# Patient Record
Sex: Female | Born: 1980 | Race: Black or African American | Hispanic: No | Marital: Single | Smoking: Never smoker
Health system: Southern US, Community
[De-identification: ages and names within clinical notes are randomized; demographics above are authoritative.]

## PROBLEM LIST (undated history)

## (undated) DIAGNOSIS — D649 Anemia, unspecified: Secondary | ICD-10-CM

## (undated) DIAGNOSIS — N926 Irregular menstruation, unspecified: Secondary | ICD-10-CM

## (undated) DIAGNOSIS — I809 Phlebitis and thrombophlebitis of unspecified site: Secondary | ICD-10-CM

## (undated) HISTORY — PX: GANGLION CYST EXCISION: SHX1691

---
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
## 2002-04-20 ENCOUNTER — Encounter: Admission: RE | Admit: 2002-04-20 | Discharge: 2002-04-20 | Payer: Self-pay | Admitting: Family Medicine

## 2002-04-27 ENCOUNTER — Encounter: Admission: RE | Admit: 2002-04-27 | Discharge: 2002-04-27 | Payer: Self-pay | Admitting: Family Medicine

## 2002-04-27 ENCOUNTER — Encounter (INDEPENDENT_AMBULATORY_CARE_PROVIDER_SITE_OTHER): Payer: Self-pay | Admitting: *Deleted

## 2002-05-02 ENCOUNTER — Ambulatory Visit (HOSPITAL_COMMUNITY): Admission: RE | Admit: 2002-05-02 | Discharge: 2002-05-02 | Payer: Self-pay | Admitting: Obstetrics and Gynecology

## 2002-06-15 ENCOUNTER — Encounter: Payer: Self-pay | Admitting: *Deleted

## 2002-06-15 ENCOUNTER — Inpatient Hospital Stay (HOSPITAL_COMMUNITY): Admission: AD | Admit: 2002-06-15 | Discharge: 2002-06-15 | Payer: Self-pay | Admitting: *Deleted

## 2002-06-21 ENCOUNTER — Inpatient Hospital Stay (HOSPITAL_COMMUNITY): Admission: AD | Admit: 2002-06-21 | Discharge: 2002-06-21 | Payer: Self-pay | Admitting: *Deleted

## 2002-06-21 ENCOUNTER — Encounter: Admission: RE | Admit: 2002-06-21 | Discharge: 2002-06-21 | Payer: Self-pay | Admitting: Family Medicine

## 2002-06-22 ENCOUNTER — Encounter: Admission: RE | Admit: 2002-06-22 | Discharge: 2002-06-22 | Payer: Self-pay | Admitting: Family Medicine

## 2002-06-29 ENCOUNTER — Inpatient Hospital Stay (HOSPITAL_COMMUNITY): Admission: AD | Admit: 2002-06-29 | Discharge: 2002-06-29 | Payer: Self-pay | Admitting: Family Medicine

## 2002-06-29 ENCOUNTER — Encounter: Admission: RE | Admit: 2002-06-29 | Discharge: 2002-06-29 | Payer: Self-pay | Admitting: Family Medicine

## 2002-07-04 ENCOUNTER — Inpatient Hospital Stay (HOSPITAL_COMMUNITY): Admission: AD | Admit: 2002-07-04 | Discharge: 2002-07-06 | Payer: Self-pay | Admitting: Obstetrics and Gynecology

## 2002-07-04 ENCOUNTER — Encounter (INDEPENDENT_AMBULATORY_CARE_PROVIDER_SITE_OTHER): Payer: Self-pay

## 2002-12-12 ENCOUNTER — Encounter: Admission: RE | Admit: 2002-12-12 | Discharge: 2002-12-12 | Payer: Self-pay | Admitting: Family Medicine

## 2003-03-15 ENCOUNTER — Emergency Department (HOSPITAL_COMMUNITY): Admission: EM | Admit: 2003-03-15 | Discharge: 2003-03-15 | Payer: Self-pay | Admitting: Emergency Medicine

## 2003-06-11 ENCOUNTER — Other Ambulatory Visit: Admission: RE | Admit: 2003-06-11 | Discharge: 2003-06-11 | Payer: Self-pay | Admitting: Family Medicine

## 2003-06-11 ENCOUNTER — Encounter: Admission: RE | Admit: 2003-06-11 | Discharge: 2003-06-11 | Payer: Self-pay | Admitting: Sports Medicine

## 2003-06-11 ENCOUNTER — Encounter (INDEPENDENT_AMBULATORY_CARE_PROVIDER_SITE_OTHER): Payer: Self-pay | Admitting: *Deleted

## 2003-06-28 ENCOUNTER — Encounter: Admission: RE | Admit: 2003-06-28 | Discharge: 2003-06-28 | Payer: Self-pay | Admitting: Sports Medicine

## 2003-11-25 ENCOUNTER — Inpatient Hospital Stay (HOSPITAL_COMMUNITY): Admission: AD | Admit: 2003-11-25 | Discharge: 2003-11-30 | Payer: Self-pay | Admitting: Obstetrics & Gynecology

## 2003-12-24 ENCOUNTER — Inpatient Hospital Stay (HOSPITAL_COMMUNITY): Admission: AD | Admit: 2003-12-24 | Discharge: 2003-12-26 | Payer: Self-pay | Admitting: Obstetrics and Gynecology

## 2004-01-01 ENCOUNTER — Inpatient Hospital Stay (HOSPITAL_COMMUNITY): Admission: AD | Admit: 2004-01-01 | Discharge: 2004-01-04 | Payer: Self-pay | Admitting: Obstetrics and Gynecology

## 2004-04-07 ENCOUNTER — Ambulatory Visit: Payer: Self-pay | Admitting: Obstetrics & Gynecology

## 2006-01-01 ENCOUNTER — Emergency Department (HOSPITAL_COMMUNITY): Admission: EM | Admit: 2006-01-01 | Discharge: 2006-01-01 | Payer: Self-pay | Admitting: Emergency Medicine

## 2006-04-06 ENCOUNTER — Emergency Department (HOSPITAL_COMMUNITY): Admission: EM | Admit: 2006-04-06 | Discharge: 2006-04-06 | Payer: Self-pay | Admitting: Family Medicine

## 2006-07-12 HISTORY — PX: TUBAL LIGATION: SHX77

## 2006-07-27 ENCOUNTER — Emergency Department (HOSPITAL_COMMUNITY): Admission: EM | Admit: 2006-07-27 | Discharge: 2006-07-27 | Payer: Self-pay | Admitting: Family Medicine

## 2006-11-24 ENCOUNTER — Inpatient Hospital Stay (HOSPITAL_COMMUNITY): Admission: AD | Admit: 2006-11-24 | Discharge: 2006-11-24 | Payer: Self-pay | Admitting: Family Medicine

## 2007-01-09 ENCOUNTER — Ambulatory Visit (HOSPITAL_COMMUNITY): Admission: RE | Admit: 2007-01-09 | Discharge: 2007-01-09 | Payer: Self-pay | Admitting: Obstetrics

## 2007-02-01 ENCOUNTER — Ambulatory Visit (HOSPITAL_COMMUNITY): Admission: RE | Admit: 2007-02-01 | Discharge: 2007-02-01 | Payer: Self-pay | Admitting: Obstetrics

## 2007-04-13 ENCOUNTER — Inpatient Hospital Stay (HOSPITAL_COMMUNITY): Admission: AD | Admit: 2007-04-13 | Discharge: 2007-04-13 | Payer: Self-pay | Admitting: Obstetrics & Gynecology

## 2007-06-05 ENCOUNTER — Observation Stay (HOSPITAL_COMMUNITY): Admission: AD | Admit: 2007-06-05 | Discharge: 2007-06-05 | Payer: Self-pay | Admitting: Obstetrics & Gynecology

## 2007-06-08 ENCOUNTER — Inpatient Hospital Stay (HOSPITAL_COMMUNITY): Admission: AD | Admit: 2007-06-08 | Discharge: 2007-06-08 | Payer: Self-pay | Admitting: Obstetrics

## 2007-06-17 ENCOUNTER — Inpatient Hospital Stay (HOSPITAL_COMMUNITY): Admission: AD | Admit: 2007-06-17 | Discharge: 2007-06-19 | Payer: Self-pay | Admitting: Obstetrics & Gynecology

## 2007-06-18 ENCOUNTER — Encounter: Payer: Self-pay | Admitting: Obstetrics & Gynecology

## 2008-02-15 ENCOUNTER — Emergency Department (HOSPITAL_COMMUNITY): Admission: EM | Admit: 2008-02-15 | Discharge: 2008-02-15 | Payer: Self-pay | Admitting: Emergency Medicine

## 2008-04-07 ENCOUNTER — Emergency Department (HOSPITAL_COMMUNITY): Admission: EM | Admit: 2008-04-07 | Discharge: 2008-04-07 | Payer: Self-pay | Admitting: Emergency Medicine

## 2008-04-12 ENCOUNTER — Emergency Department (HOSPITAL_COMMUNITY): Admission: EM | Admit: 2008-04-12 | Discharge: 2008-04-12 | Payer: Self-pay | Admitting: Family Medicine

## 2008-11-07 IMAGING — US US OB FOLLOW-UP
1 series · 14 of 28 positions shown · non-contrast
Comparison: none

OBSTETRICAL ULTRASOUND:

 This ultrasound exam was performed in the [HOSPITAL] Ultrasound Department.  The OB US report was generated in the AS system, and faxed to the ordering physician.  This report is also available in [REDACTED] PACS.

[Series 1: us ob follow-up · 0.30mm/px · 14 of 46 slices shown]
[im 2/46]
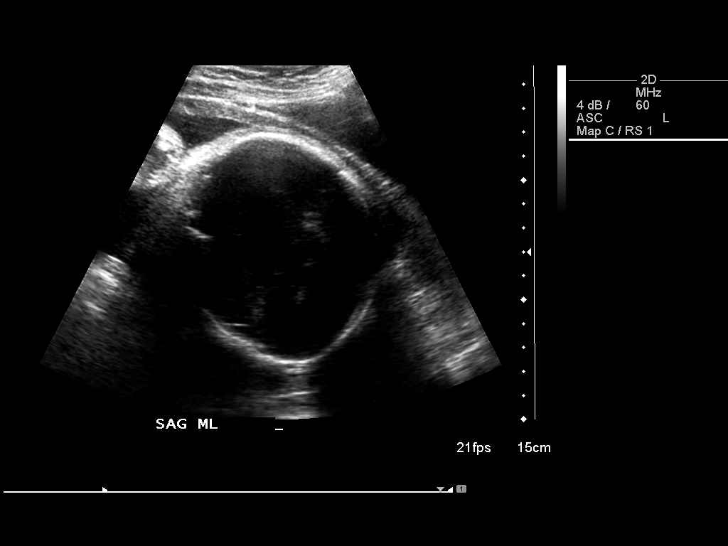
[im 6/46]
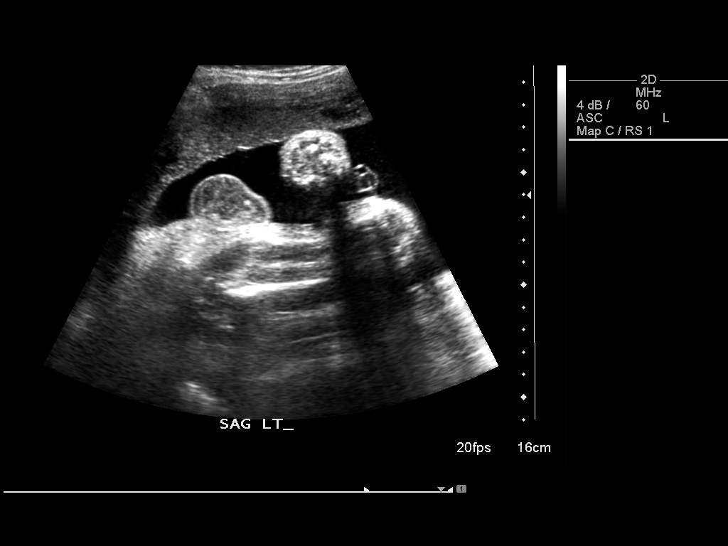
[im 9/46]
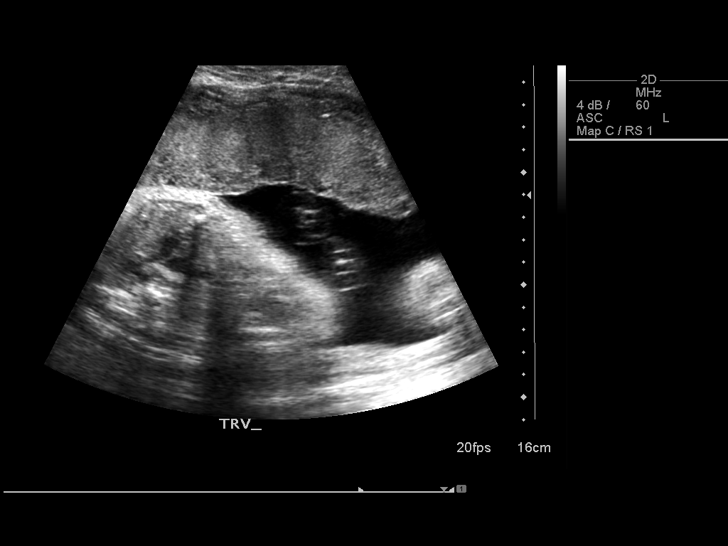
[im 12/46]
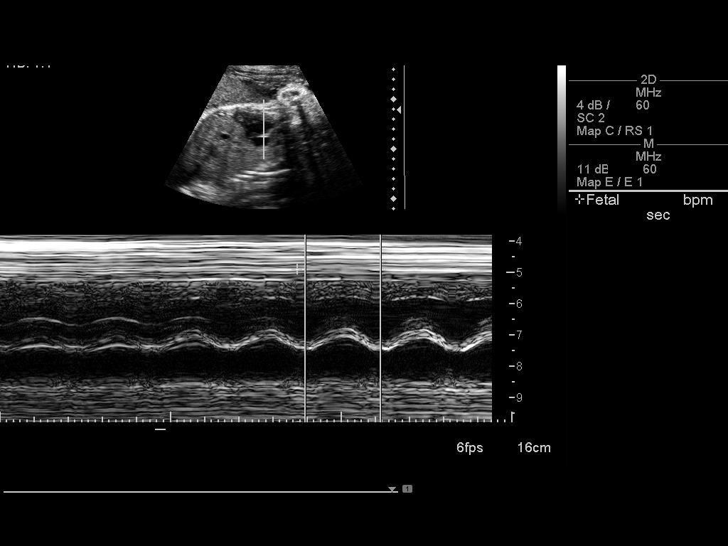
[im 16/46]
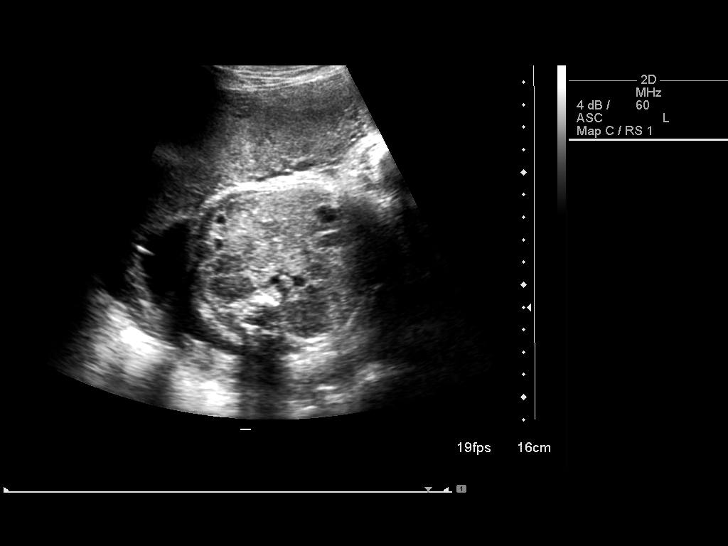
[im 19/46]
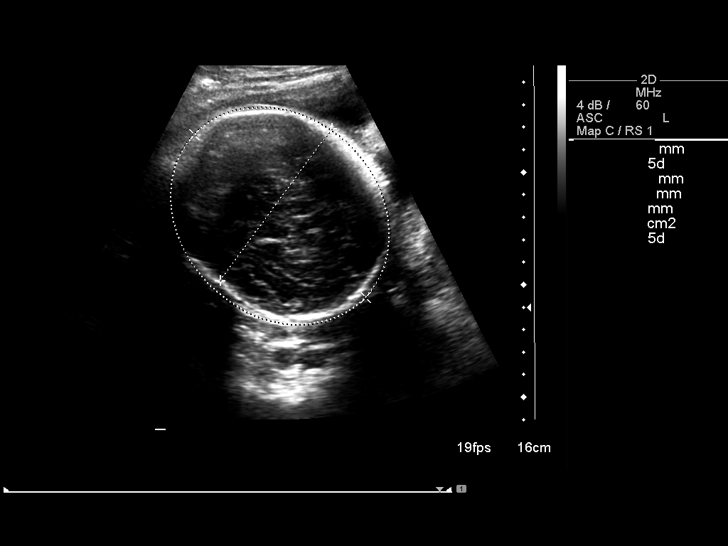
[im 22/46]
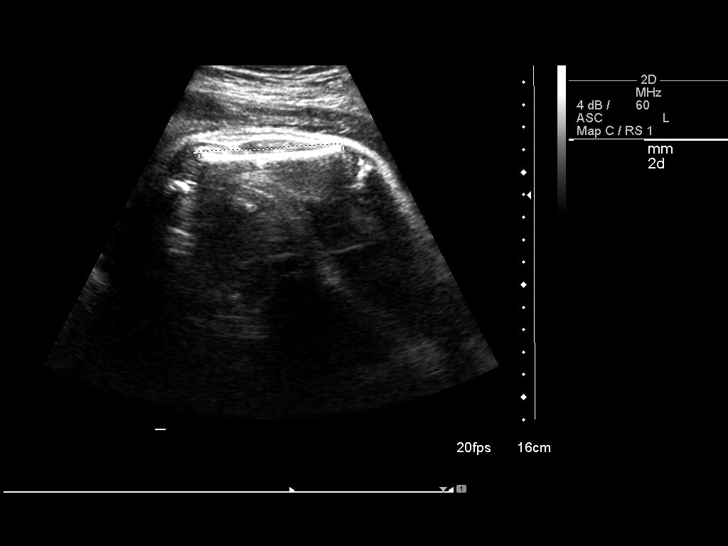
[im 26/46]
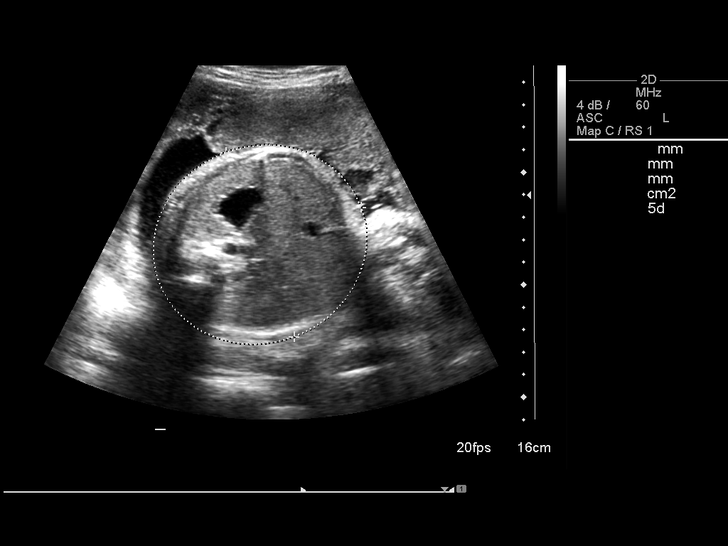
[im 29/46]
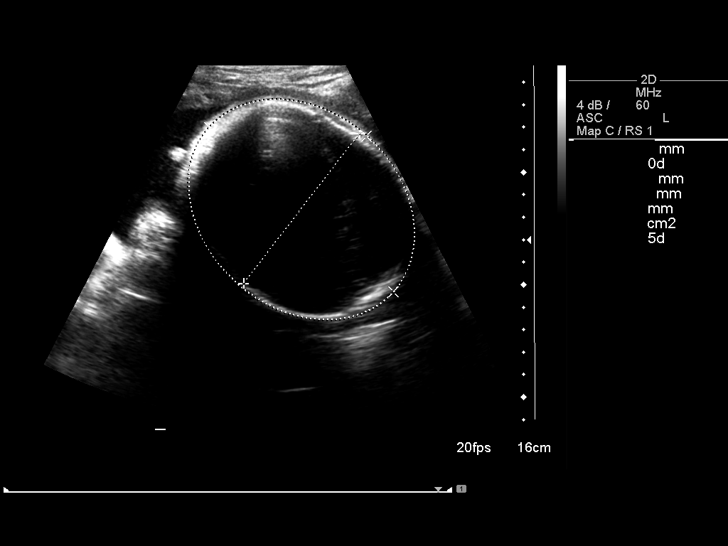
[im 32/46]
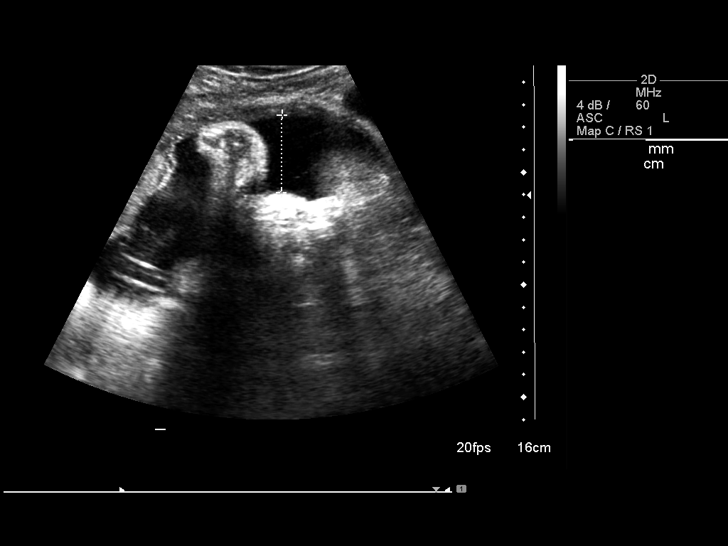
[im 36/46]
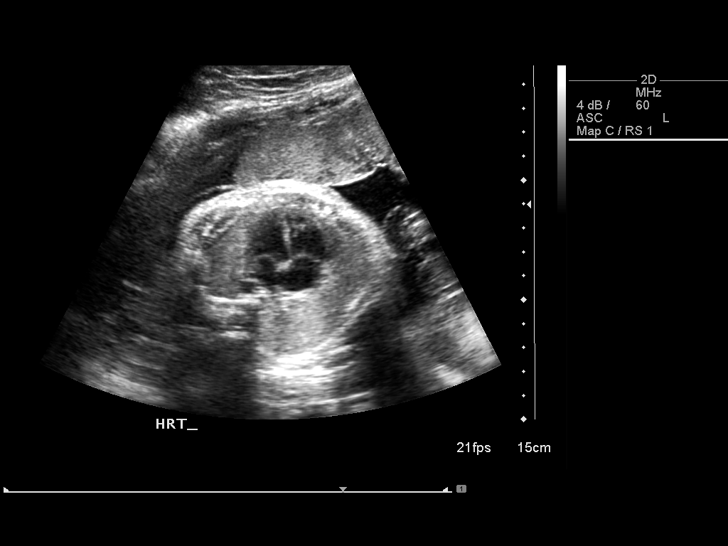
[im 39/46]
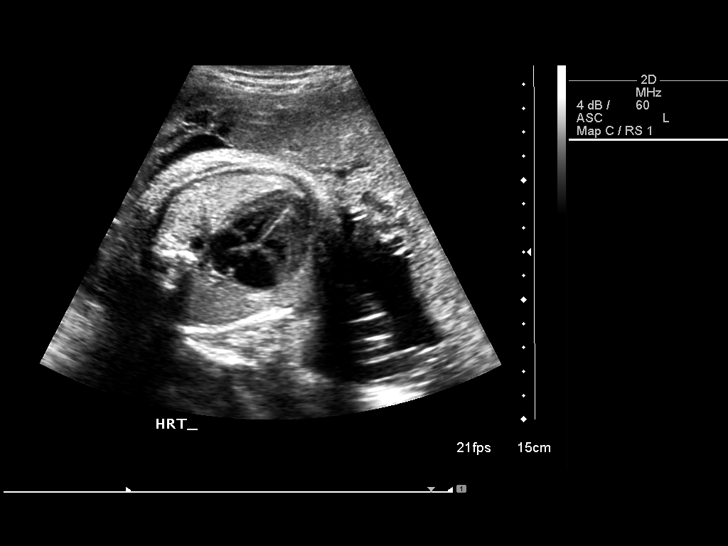
[im 42/46]
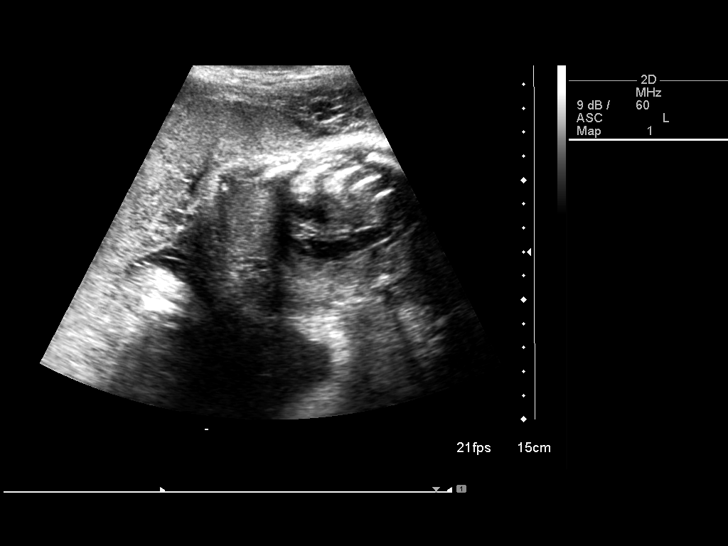
[im 46/46]
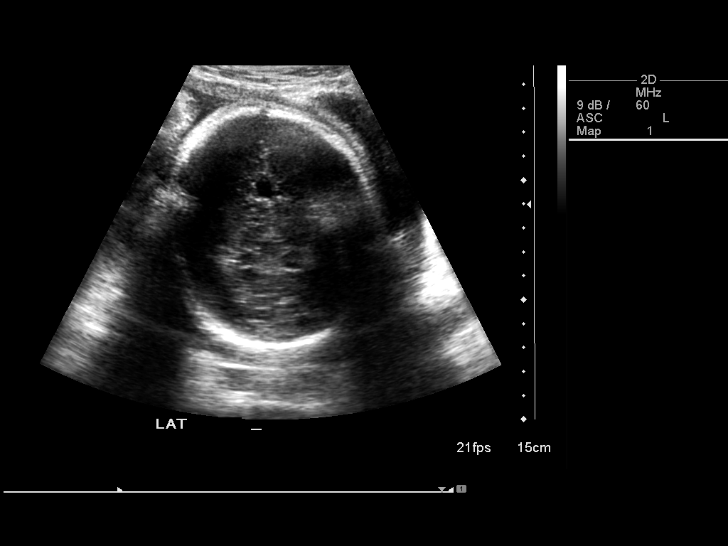

[14 of 28 positions shown; findings below may reference images not displayed]

IMPRESSION: See AS Obstetric US report.

## 2010-01-04 ENCOUNTER — Emergency Department (HOSPITAL_COMMUNITY): Admission: EM | Admit: 2010-01-04 | Discharge: 2010-01-04 | Payer: Self-pay | Admitting: Family Medicine

## 2010-08-02 ENCOUNTER — Encounter: Payer: Self-pay | Admitting: Obstetrics

## 2010-09-30 ENCOUNTER — Inpatient Hospital Stay (INDEPENDENT_AMBULATORY_CARE_PROVIDER_SITE_OTHER)
Admission: RE | Admit: 2010-09-30 | Discharge: 2010-09-30 | Disposition: A | Discharge status: Home / Self Care | Payer: Medicaid Other | Source: Ambulatory Visit | Attending: Emergency Medicine | Admitting: Emergency Medicine

## 2010-09-30 DIAGNOSIS — S61409A Unspecified open wound of unspecified hand, initial encounter: Secondary | ICD-10-CM

## 2010-11-24 NOTE — H&P (Signed)
NAMEWANEDA, KLAMMER                   ACCOUNT NO.:  [id number]   MEDICAL RECORD NO.:  [id number]          PATIENT TYPE:  INP   LOCATION:  9169                          [facility]ACILITY:  [facility]   PHYSICIAN:  Roseanna Rainbow, M.D.DATE OF BIRTH:  [birth year]   DATE OF ADMISSION:  06/17/2007  DATE OF DISCHARGE:                              HISTORY & PHYSICAL   CHIEF COMPLAINT:  The patient is a 30 year old gravida 5, para 4 with an  estimated date of confinement of July 11, 2007 with an intrauterine  pregnancy at 37 weeks complaining of uterine contractions.   HISTORY OF PRESENT ILLNESS:  Please see the above.   ALLERGIES:  NO KNOWN DRUG ALLERGIES.   MEDICATIONS:  Please see the medication reconciliation form.   LABORATORY DATA:  Prenatal labs:  Blood type B+, antibody screen  negative, RPR nonreactive, rubella immune, hepatitis B surface antigen  negative, HIV nonreactive.   DIAGNOSTICS:  Ultrasound on February 01, 2007 at 17 weeks 1 day gave a due  date of July 11, 2007.  There was no previa.   PAST GYNECOLOGICAL HISTORY:  Noncontributory.   PAST MEDICAL HISTORY:  She denies.   PAST SURGICAL HISTORY:  Hand surgery.   SOCIAL HISTORY:  She is a homemaker, single.  No tobacco, ethanol or  drug use.   FAMILY HISTORY:  Uterine cancer.   PAST OBSTETRICAL HISTORY:  In 2001, she was delivered of a live born  female, 6 pounds 9 ounces vaginal delivery, full term.  In 2003, she was  delivered of a live born female 6 pounds 7 ounces vaginal delivery.  In  2003, she had a preterm delivery at 59 weeks female 3 pounds 4 ounces  vaginal delivery.  In 2005, she had a live born female full term vaginal  delivery 6 pounds.   DELIVERY NOTE:  The patient had a precipitous delivery of a live born  female.  The cord was clamped and cut.  The placenta was delivered with  cord traction intact, three-vessel cord.  There were minor vaginal and  labial abrasions.  No lacerations.  Estimated blood loss less  than 500  cc.  Mother and infant in stable condition.      Roseanna Rainbow, M.D.  Electronically Signed     LAJ/MEDQ  D:  06/17/2007  T:  06/17/2007  Job:  [id number]

## 2010-11-24 NOTE — Op Note (Signed)
Claudia Washington, Claudia Washington                   ACCOUNT NO.:  [id number]   MEDICAL RECORD NO.:  [id number]          PATIENT TYPE:  INP   LOCATION:  9128                          [facility]ILITY[facility]   PHYSICIAN:  Roseanna Rainbow, M.D.DATE OF BIRTH:  [birth year]   DATE OF PROCEDURE:  06/18/2007  DATE OF DISCHARGE:                               OPERATIVE REPORT   PREOPERATIVE DIAGNOSIS:  Multiparity, desires a sterilization procedure.   POSTOPERATIVE DIAGNOSIS:  Multiparity, desires a sterilization  procedure.   PROCEDURES:  1. Modified Pomeroy bilateral tubal ligation.  2. Removal of a paratubal cyst.   SURGEON:  Roseanna Rainbow, MD   ANESTHESIA:  General endotracheal.   ESTIMATED BLOOD LOSS:  Minimal.   COMPLICATIONS:  None.   IV FLUIDS:  As per anesthesiology.   PROCEDURE:  The patient was taken to the operating room.  A general  anesthetic was induced without difficulty.  She was then placed in the  dorsal supine position and prepped and draped in the usual sterile  fashion.  After a time-out had been completed, an infraumbilical skin  incision was made with a scalpel and carried down to the underlying  fascia.  The fascia was tented up and entered sharply.  This incision  was extended.  The parietal peritoneum was tented up and entered  sharply.  This incision was then extended bilaterally and was free of  adhesions.  The left fallopian tube was grasped with a Babcock clamp and  followed out to the fimbriated end.  The midisthmic portion of the tube  was regrasped.  A 2-cm segment of tube was then doubly ligated with 0  plain and excised.  The right fallopian tube was manipulated in a  similar fashion.  Emanating from the ampullary portion was a 1-cm  hydatid cyst of Morgagni, which was excised with Metzenbaum scissors.  The stalk was cauterized.  Adequate hemostasis was noted.  The fascia  was then reapproximated in a running fashion using 0 Vicryl.  The skin  was closed in a  subcuticular fashion using 3-0 Monocryl.  At the close  of the procedure, the instrument and pack counts were said to be correct  x2.  The patient was taken to the PACU awake and in stable condition.      Roseanna Rainbow, M.D.  Electronically Signed     LAJ/MEDQ  D:  06/18/2007  T:  06/19/2007  Job:  [id number]

## 2010-11-27 NOTE — Discharge Summary (Signed)
NAMEDESHONDRA, WORST                             ACCOUNT NO.:  [id number]   MEDICAL RECORD NO.:  [id number]                   PATIENT TYPE:  INP   LOCATION:  9151                                 [facility]  [facility]   PHYSICIAN:  Jeb Levering, M.D.               DATE OF BIRTH:  04-22-1981   DATE OF ADMISSION:  11/25/2003  DATE OF DISCHARGE:  11/26/2003                                 DISCHARGE SUMMARY   HISTORY OF PRESENT ILLNESS:  This is a 30 year old, gravida 4, term 2,  preterm 1, abortion 0, living 3, who presented to the [facility] on Nov 26, 2003  with a complaint of contractions.  She did admit to no prenatal care at that  time.  She had normal vital signs with a normal exam, except for a vaginal  exam which was 2-3 cm dilated, 50% effaced, and -2 station.  She was  admitted for tocolytics and antibiotics.   HOSPITAL COURSE:  She did get betamethasone x2.  She was on Unasyn 3 gm IV  q.6h., and was begun on magnesium sulfate with a 4 gm bolus and 2 gm an  hour.  She did stay on the magnesium sulfate until her contractions were  resolved, and at that time was changed over to Procardia.  She also had her  initial OB lab work.  Blood type was found to be positive, and rubella was  immune.  Her HIV test was negative.  Group B strep was negative.  Urine drug  screen was negative.  Her hemoglobin was 7.4.  On Nov 30, 2003, she is  without contractions and complaints, and she is to be discharged home.  During the course of her hospitalization, she did have some diarrhea, and a  Clostridium difficile culture has been sent to the lab.  She was also found  to have positive gonorrhea and Chlamydia, for which she was treated with  Zithromax and Rocephin, which she felt may be the cause of her diarrhea.   ADMISSION DIAGNOSIS:  1. Preterm labor.  2. No prenatal care.   DISCHARGE DIAGNOSIS:  1. Preterm labor, resolved.  2. No prenatal care.  3. Severe anemia.   DISCHARGE INSTRUCTIONS:  1. Bed  rest at home.  She can be up for meals and to bathe and to the     bathroom.  2. No sexual intercourse.  3. [facility].  She is to call for     an appointment.   DISCHARGE MEDICATIONS:  1. Prenatal vitamin one p.o. daily.  2. Ferrous sulfate 325 mg p.o. t.i.d.  3. Colace 100 mg p.o. t.i.d.  4. Procardia 30 XL one p.o. b.i.d.  Jeb Levering, M.D.   [place]/MEDQ  D:  11/30/2003  T:  11/30/2003  Job:  [id number]

## 2010-11-27 NOTE — Discharge Summary (Signed)
[facility]  Patient:    Claudia Washington, Claudia Washington Visit Number: [id number] MRN: [id number]          Service Type: OBS Location: 910A 9120 01 Attending Physician:  Tammi Sou Dictated by:   Marlinda Mike, CNM Admit Date:  07/05/2001 Discharge Date: 07/06/2001                             Discharge Summary  ADMISSION DIAGNOSIS:          Intrauterine pregnancy at 29-4/7 weeks with preterm labor, cervicitis, condition stable.  DISCHARGE DIAGNOSIS:          Intrauterine pregnancy at 29 weeks and 5 days, preterm contractions without cervical change, condition stable.  HOSPITAL COURSE:              The patient admitted to the hospital for preterm labor with a reported history of preterm contractions.  No regular contractions per monitor.  Fetal heart rate tracing reactive.  Vital signs stable, afebrile.  The patient received a course of IV Unasyn IV q.6h. Started on Procardia XL 60 mg p.o. q.d.  Received betamethasone x a full course.  Initial dose on admission with repeat at 24 hours.  Cervical exam: Fingertip, 50%, vertex high, with a lower uterine segment development.  No change from previous exams.  Ultrasound done during hospitalization revealed singleton in a vertex presentation with an AFI of 16.0.  Growth at the 55th percentile with a cervical length of 3.1 cm.  ______ sent with a negative result.  A urine drug screen during hospitalization negative.  PAST OBSTETRICAL HISTORY:     The patient is an 30 year old gravida 2, para 1-0-0-1 with an EDC of September 17, 2001.  OB history includes term spontaneous vaginal delivery in 2001 with a 2-3 hour labor.  OB history significant for postpartum hemorrhage related to uterine atony following delivery.  PAST MEDICAL HISTORY:         The patient has no significant medical or surgical history.  PRENATAL LABORATORY DATA:     The patient is B+ with an antibody screen negative.  RPR nonreactive, rubella  immune.  Hepatitis negative.  HIV status negative.  Gonorrhea and chlamydia cultures negative.  The patient is followed at the family practice center by Dr. Milinda Cave.  A one-hour Glucola was 110.  DISCHARGE INSTRUCTIONS:       The patient discharged home on July 06, 2001 with instructions to continue Procardia at home, follow at the family practice center with Dr. Milinda Cave as instructed.  Preterm labor cautions reviewed.  The patient placed on modified bedrest at home and pelvic rest.  She will return to the hospital with regular contractions, leaking of fluid, any bleeding or decreased fetal movement. Dictated by:   Marlinda Mike, CNM Attending Physician:  Tammi Sou DD:  07/20/01 TD:  07/20/01 Job: [id number]

## 2010-11-27 NOTE — Discharge Summary (Signed)
[facility]  Patient:    Claudia Washington, Claudia Washington Visit Number: [id number] MRN: [id number]          Service Type: OBS Location: 910B 9157 01 Attending Physician:  Michaelle Copas Dictated by:   Salvadore Dom, M.D. Admit Date:  06/13/2001 Discharge Date: 06/16/2001   CC:         Nicoletta Ba, M.D.   Discharge Summary  DATE OF BIRTH:                November 01, 1980  REASON FOR ADMISSION:         Preterm labor check.  HISTORY OF PRESENT ILLNESS:   An 30 year old G2, P1-0-0-1, presented at 26-2/7 weeks, with contractions and questionable preterm labor.  It appears she is a patient of Dr. Verdis Frederickson at [facility].  She was seen and checked there and felt to have some cervical change.  The patient reported contractions since the day prior to admission, as well as some light vaginal bleeding.  She denied leakage of fluid.  She denied positive fetal activity.  PREGNANCY COMPLICATIONS:      The patient is adolescent.  This is the second pregnancy within a year.  MEDICATIONS:                  None.  ALLERGIES:                    None.  OBSTETRICAL HISTORY:          She had a term spontaneous vaginal delivery, three-hour labor.  Postpartum hemorrhage associated with it.  GYN HISTORY:                  BV since 16 weeks, abnormal Pap with high-grade CIN-2 lesion.  MEDICAL HISTORY:              None.  SURGICAL HISTORY:             None.  FAMILY HISTORY:               None.  SOCIAL HISTORY:               Adolescent.  Denied history of substance abuse.  LABORATORY/ACCESSORY DATA:    Prenatal labs were done and were normal.  Most recent ultrasound at 23 weeks showed 49% AFI normal, breech presentation. Cervix 3 cm.  HOSPITAL COURSE:              The patient was admitted, placed on Unasyn, betamethasone and Motrin.  On hospital day #1, she still had some mild contractions, that were intermittent.  Cervical check was 1 cm/50-60%/-3, which  was not significant change from her admission exam.  She was continued on Unasyn throughout the hospitalization for a full 72 hours.  Motrin was discontinued after 48 hours.  She had no contractions x1 day off of the Motrin and was stable at discharge.  Her cervical exam was improved.  It was a fingertip, was long and high.  DISCHARGE ACTIVITY:           Bed rest.  She is to remain on bed rest until seen by Dr. Milinda Cave and will probably need complete bed rest throughout the rest of the pregnancy and homebound education.  DISCHARGE MEDICATIONS:        Augmentin 875 one tablet twice a day for seven days for a total course of 10 days of antibiotics.  DISCHARGE DIET:  Regular.  DISCHARGE FOLLOW-UP:          She is to be seen at [facility] by Dr. Milinda Cave on Wednesday, June 21, 2001.  DISCHARGE CONDITION:          Improved.  DISCHARGE DIAGNOSES:          1. Intrauterine pregnancy at 26-5/7 weeks.                               2. Preterm labor with cervical change, resolved.                               3. Adolescent pregnancy. Dictated by:   Salvadore Dom, M.D. Attending Physician:  Michaelle Copas DD:  06/16/01 TD:  06/16/01 Job: [phone]

## 2010-11-27 NOTE — Group Therapy Note (Signed)
Claudia Washington, Claudia Washington NO.:  [id number]   MEDICAL RECORD NO.:  [id number]          PATIENT TYPE:  WOC   LOCATION:  [facility]                   FACILITY:  [facility]   PHYSICIAN:  Elsie Lincoln, MD      DATE OF BIRTH:  [birth year]   DATE OF SERVICE:  04/07/2004                                    CLINIC NOTE   HISTORY OF PRESENT ILLNESS:  This is a 30 year old G4 para 4-0-0-4 status  post NSVD on April 03, 2004.  The patient desires permanent  sterilization.  She is currently on Depo.  Last injection was January 04, 2004.  She is 3 days late for her next injection.  However, UCG was negative so  Depo was given today.   PAST MEDICAL HISTORY:  Denies.   PAST SURGICAL HISTORY:  Denies.   PAST GYNECOLOGICAL HISTORY:  Denies ovarian cysts, fibroid tumors, sexually-  transmitted diseases.  However, the patient has had low-grade SIL x2 and is  undergoing colposcopic evaluation at [facility].   ALLERGIES:  None.   MEDICATIONS:  None.   FAMILY HISTORY:  Denies bleeding problems or any other major medical  problems contributory to this surgery.   PHYSICAL EXAMINATION:  VITAL SIGNS:  Temperature 97.7, pulse 83, blood  pressure 116/76, weight 128.2, height 5 feet 4 inches.  GENERAL:  Well-nourished, well-developed, no apparent distress.  HEENT:  Normocephalic, atraumatic.  LUNGS:  Clear to auscultation bilaterally.  CARDIOVASCULAR:  Regular rate and rhythm.  ABDOMEN:  Soft, nontender, nondistended.  SKIN:  No rashes.  PELVIC:  To be completed in OR.   ASSESSMENT:  A 30 year old female para 4-0-0-4 desiring permanent  sterilization.   PLAN:  Laparoscopic BTL.  Consent signed today.  Will schedule in 6 weeks.     KL/MEDQ  D:  04/07/2004  T:  04/07/2004  Job:  [id number]

## 2011-04-19 LAB — CBC
HCT: 35.6 — ABNORMAL LOW
Hemoglobin: 10.8 — ABNORMAL LOW
Hemoglobin: 11.6 — ABNORMAL LOW
MCHC: 32.6
MCV: 82.9
MCV: 83.4
RBC: 4.27
RDW: 15
RDW: 15.3
WBC: 9.6

## 2011-04-19 LAB — RPR: RPR Ser Ql: NONREACTIVE

## 2011-04-20 LAB — DIFFERENTIAL
Basophils Absolute: 0
Basophils Relative: 0
Eosinophils Absolute: 0 — ABNORMAL LOW
Eosinophils Relative: 0
Lymphocytes Relative: 19
Monocytes Relative: 8
Neutro Abs: 4.7
Neutrophils Relative %: 72

## 2011-04-20 LAB — COMPREHENSIVE METABOLIC PANEL
CO2: 23
Chloride: 104
GFR calc Af Amer: >60
Potassium: 3 — ABNORMAL LOW
Total Protein: 6

## 2011-04-20 LAB — CBC
HCT: 32 — ABNORMAL LOW
Hemoglobin: 11.2 — ABNORMAL LOW
MCHC: 33.3
MCHC: 33.5
Platelets: 238
RBC: 3.83 — ABNORMAL LOW
RBC: 3.99
WBC: 6.5
WBC: 8.3

## 2011-04-20 LAB — URINALYSIS, ROUTINE W REFLEX MICROSCOPIC
Bilirubin Urine: NEGATIVE
Nitrite: NEGATIVE
Protein, ur: NEGATIVE
Urobilinogen, UA: 0.2
pH: 6

## 2011-04-20 LAB — URINE MICROSCOPIC-ADD ON

## 2011-04-20 LAB — URINALYSIS, DIPSTICK ONLY: Nitrite: NEGATIVE

## 2011-04-20 LAB — URINE CULTURE: Special Requests: NEGATIVE

## 2011-04-20 LAB — RPR: RPR Ser Ql: NONREACTIVE

## 2011-04-22 LAB — URINALYSIS, ROUTINE W REFLEX MICROSCOPIC
Bilirubin Urine: NEGATIVE
Ketones, ur: 15 — AB
Nitrite: NEGATIVE
Specific Gravity, Urine: 1.02

## 2011-04-22 LAB — URINE MICROSCOPIC-ADD ON

## 2011-11-08 ENCOUNTER — Encounter (HOSPITAL_COMMUNITY): Payer: Self-pay | Admitting: Emergency Medicine

## 2011-11-08 ENCOUNTER — Emergency Department (INDEPENDENT_AMBULATORY_CARE_PROVIDER_SITE_OTHER)
Admission: EM | Admit: 2011-11-08 | Discharge: 2011-11-08 | Disposition: A | Discharge status: Home / Self Care | Payer: Self-pay | Source: Home / Self Care | Attending: Emergency Medicine | Admitting: Emergency Medicine

## 2011-11-08 DIAGNOSIS — I809 Phlebitis and thrombophlebitis of unspecified site: Secondary | ICD-10-CM

## 2011-11-08 HISTORY — DX: Irregular menstruation, unspecified: N92.6

## 2011-11-08 HISTORY — DX: Anemia, unspecified: D64.9

## 2011-11-08 MED ORDER — NAPROXEN 500 MG PO TABS: 500.0000 mg | ORAL_TABLET | Freq: Two times a day (BID) | ORAL | Status: DC

## 2011-11-08 NOTE — ED Provider Notes (Signed)
History     CSN: [id number]  Arrival date & time 11/08/11  1417   First MD Initiated Contact with Patient 11/08/11 1610      Chief Complaint  Patient presents with  . Edema    (Consider location/radiation/quality/duration/timing/severity/associated sxs/prior treatment) HPI Comments: Patient reports atraumatic pain underneath her right ankle is starting approximately a week ago. No 600, palpable cord in this area last night. No redness, axillary swelling, purulent drainage. Does not recall any change in physical activity or recent trauma to the area. Pain is worse with palpation. No alleviating factors. She's not tried anything for this. No chest pain, shortness of breath. No arm swelling, redness, or other color changes down her right arm. No numbness, weakness, paresthesias. No history of DVT or PE.  ROS as noted in HPI. All other ROS negative.   Patient is a 31 y.o. female presenting with extremity pain. The history is provided by the patient. No language interpreter was used.  Extremity Pain This is a new problem. The current episode started more than 1 week ago. The problem has been gradually worsening. Pertinent negatives include no chest pain and no shortness of breath. The symptoms are aggravated by nothing. The symptoms are relieved by nothing. She has tried nothing for the symptoms. The treatment provided no relief.    Past Medical History  Diagnosis Date  . Anemia   . Irregular menses     Past Surgical History  Procedure Date  . Tubal ligation   . Ganglion cyst excision     History reviewed. No pertinent family history.  History  Substance Use Topics  . Smoking status: Never Smoker   . Smokeless tobacco: Not on file  . Alcohol Use: No    OB History    Grav Para Term Preterm Abortions TAB SAB Ect Mult Living                  Review of Systems  Respiratory: Negative for shortness of breath.   Cardiovascular: Negative for chest pain.    Allergies    Review of patient's allergies indicates no known allergies.  Home Medications   Current Outpatient Rx  Name Route Sig Dispense Refill  . NAPROXEN 500 MG PO TABS Oral Take 1 tablet (500 mg total) by mouth 2 (two) times daily. 20 tablet 0    BP 98/57  Pulse 70  Temp(Src) 97.8 F (36.6 C) (Oral)  Resp 14  SpO2 100%  LMP 09/11/2011  Physical Exam  Nursing note and vitals reviewed. Constitutional: She is oriented to person, place, and time. She appears well-developed and well-nourished. No distress.  HENT:  Head: Normocephalic and atraumatic.  Eyes: Conjunctivae and EOM are normal.  Neck: Normal range of motion.  Cardiovascular: Normal rate.   Pulmonary/Chest: Effort normal.  Abdominal: She exhibits no distension.  Musculoskeletal: Normal range of motion.       Arms:      5 cm tender palpable cord underneath right upper arm extending to the axilla. No redness, arm swelling, collateral veins, other tenderness. No axillary lymphadenopathy. no evidence of abscess rest of arm, elbow, forearm, wrist normal.  Neurological: She is alert and oriented to person, place, and time.  Skin: Skin is warm and dry.  Psychiatric: She has a normal mood and affect. Her behavior is normal. Judgment and thought content normal.    ED Course  Procedures (including critical care time)  Labs Reviewed - No data to display No results found.   1.  Superficial thrombophlebitis       MDM  H&P most consistent with superficial thrombophlebitis. Sending home with warm compresses and distances. No evidence of abscess, lymphadenopathy. Patient denies any chest pain, shortness of breath. No arm swelling, collateral veins  suspicious for DVT of the upper extremity. Patient to return to the ER for chest pain, shortness of breath, fevers, redness, swelling in the axilla, lymphangitis, other concerns. Patient agrees with plan.  Luiz Blare, MD 11/08/11 [phone]

## 2011-11-08 NOTE — Discharge Instructions (Signed)
Warm compresses. Take all the naprosyn even if you feel better. Return to the ED for any of the signs and symptoms that we talked about.

## 2011-11-08 NOTE — ED Notes (Signed)
PT HERE WITH POSS LYMPH NODE SWELLING FORMING UNDER R AXILLA OR BOIL THAT FLARED UP LAST NIGHT.SORE PAIN WITH ACTIVITY AND TOUCH.DENIES FEVER,CHILLS

## 2012-02-08 ENCOUNTER — Emergency Department (HOSPITAL_COMMUNITY)
Admission: EM | Admit: 2012-02-08 | Discharge: 2012-02-08 | Disposition: A | Discharge status: Home / Self Care | Payer: Medicaid Other | Source: Home / Self Care | Attending: Emergency Medicine | Admitting: Emergency Medicine

## 2012-02-08 ENCOUNTER — Emergency Department (INDEPENDENT_AMBULATORY_CARE_PROVIDER_SITE_OTHER): Payer: Medicaid Other

## 2012-02-08 ENCOUNTER — Encounter (HOSPITAL_COMMUNITY): Payer: Self-pay | Admitting: Emergency Medicine

## 2012-02-08 DIAGNOSIS — R0789 Other chest pain: Secondary | ICD-10-CM

## 2012-02-08 DIAGNOSIS — J029 Acute pharyngitis, unspecified: Secondary | ICD-10-CM

## 2012-02-08 HISTORY — DX: Phlebitis and thrombophlebitis of unspecified site: I80.9

## 2012-02-08 LAB — POCT RAPID STREP A: Streptococcus, Group A Screen (Direct): NEGATIVE

## 2012-02-08 MED ORDER — CYCLOBENZAPRINE HCL 10 MG PO TABS: 10.0000 mg | ORAL_TABLET | Freq: Three times a day (TID) | ORAL | Status: AC | PRN

## 2012-02-08 MED ORDER — NAPROXEN 500 MG PO TABS: 500.0000 mg | ORAL_TABLET | Freq: Two times a day (BID) | ORAL | Status: AC

## 2012-02-08 NOTE — ED Notes (Signed)
Left side of rib cage hurts with inspiration for one week, reports mid back pain, across mid back this am, sore throat onset last night.

## 2012-02-08 NOTE — ED Provider Notes (Signed)
History     CSN: [id number]  Arrival date & time 02/08/12  1558   First MD Initiated Contact with Patient 02/08/12 1619      Chief Complaint  Patient presents with  . Sore Throat    (Consider location/radiation/quality/duration/timing/severity/associated sxs/prior treatment) HPI Comments: Patient presents with several complaints: First, patient reports achy, left sided posterior rib pain with deep inspiration for the past week. Also states it hurts with torso rotation, lateral bending. Does not recall  any recent or remote trauma, change in physical activity. no rash. No diaphoresis, exertional component, coughing, wheezing, shortness of breath. No nausea, vomiting, fevers.   Second, patient states she woke up this morning with bilateral mid thoracic back "soreness", worse with bending forward. States that the pain her back is different than the pain that she's been experiencing over the past week. Additional history reveals that she has recently spent prolonged periods of time bending forward to braid hair, which she has been doing for some of her relatives recently.  Third, patient reports right-sided sore throat starting last night. Took some Tylenol earlier with improvement. No ear pain, nausea, vomiting, fevers, trismus, drooling, voice changes, sensation of throat swelling shut, reflux symptoms. No known sick contacts.  Patient is a 31 y.o. female presenting with pharyngitis and chest pain. The history is provided by the patient.  Sore Throat This is a new problem. The current episode started yesterday. The problem occurs constantly. The problem has not changed since onset.Associated symptoms include chest pain. Pertinent negatives include no shortness of breath. The symptoms are aggravated by swallowing. The symptoms are relieved by acetaminophen. She has tried acetaminophen for the symptoms. The treatment provided mild relief.  Chest Pain Pertinent negatives for primary symptoms  include no shortness of breath.     Past Medical History  Diagnosis Date  . Anemia   . Irregular menses   . Superficial thrombophlebitis     Past Surgical History  Procedure Date  . Tubal ligation   . Ganglion cyst excision     History reviewed. No pertinent family history.  History  Substance Use Topics  . Smoking status: Never Smoker   . Smokeless tobacco: Not on file  . Alcohol Use: No    OB History    Grav Para Term Preterm Abortions TAB SAB Ect Mult Living                  Review of Systems  Respiratory: Negative for shortness of breath.   Cardiovascular: Positive for chest pain.    Allergies  Review of patient's allergies indicates no known allergies.  Home Medications   Current Outpatient Rx  Name Route Sig Dispense Refill  . ACETAMINOPHEN 500 MG PO TABS Oral Take 500 mg by mouth every 6 (six) hours as needed.    . CYCLOBENZAPRINE HCL 10 MG PO TABS Oral Take 1 tablet (10 mg total) by mouth 3 (three) times daily as needed for muscle spasms. 20 tablet 0  . NAPROXEN 500 MG PO TABS Oral Take 1 tablet (500 mg total) by mouth 2 (two) times daily. 20 tablet 0    BP 132/89  Pulse 74  Temp 98.3 F (36.8 C) (Oral)  Resp 18  SpO2 98%  LMP 02/02/2012  Physical Exam  Nursing note and vitals reviewed. Constitutional: She is oriented to person, place, and time. She appears well-developed and well-nourished. No distress.  HENT:  Head: Normocephalic and atraumatic. No trismus in the jaw.  Nose: Nose normal. Right  sinus exhibits no maxillary sinus tenderness and no frontal sinus tenderness. Left sinus exhibits no maxillary sinus tenderness.  Mouth/Throat: Uvula is midline and mucous membranes are normal. Posterior oropharyngeal erythema present. No oropharyngeal exudate.  Eyes: Conjunctivae and EOM are normal.  Neck: Normal range of motion.  Cardiovascular: Normal rate.   Pulmonary/Chest: Effort normal. No respiratory distress. She exhibits tenderness.    Abdominal: She exhibits no distension. There is no tenderness.  Musculoskeletal: Normal range of motion.       Thoracic back: She exhibits tenderness and pain. She exhibits no bony tenderness, no swelling and no edema.       Back:       Red- tenderness, pain provoked with deep inspiration. Blue, muscular tenderness.  Lymphadenopathy:    She has no cervical adenopathy.  Neurological: She is alert and oriented to person, place, and time. Coordination normal.  Skin: Skin is warm and dry.  Psychiatric: She has a normal mood and affect. Her behavior is normal. Judgment and thought content normal.    ED Course  Procedures (including critical care time)   Labs Reviewed  POCT RAPID STREP A ([facility] ONLY)  D-DIMER, QUANTITATIVE   Dg Chest 2 View  02/08/2012  *RADIOLOGY REPORT*  Clinical Data: Chest pain.  CHEST - 2 VIEW  Comparison: No priors.  Findings: Lung volumes are normal.  No consolidative airspace disease.  No pleural effusions.  No pneumothorax.  No pulmonary nodule or mass noted.  Pulmonary vasculature and the cardiomediastinal silhouette are within normal limits.  IMPRESSION: 1. No radiographic evidence of acute cardiopulmonary disease.  Original Report Authenticated By: Florencia Reasons, M.D.     1. Pharyngitis   2. Chest pain, musculoskeletal    Results for orders placed during the hospital encounter of 02/08/12  POCT RAPID STREP A ([facility] ONLY)      Component Value Range   Streptococcus, Group A Screen (Direct) NEGATIVE  NEGATIVE  D-DIMER, QUANTITATIVE      Component Value Range   D-Dimer, Quant 0.26  0.00 - 0.48 ug/mL-FEU     MDM   Physical is consistent with viral pharyngitis. Checking strep. No history of reflux.  Is not tachycardic, hypoxic.  Left-sided chest pain suggestive of MSK etiology, and pt PERC negative. but since patient had atraumatic superficial thrombophlebitis of the upper extremity 3 months ago, will check d-dimer. Also checking x-ray.      X-ray reviewed by myself. Full report per radiologist. Dimer negative. Discussed this with patient. Will send home with Naprosyn, Flexeril, Tylenol. Discussed signs and symptoms that should prompt return to the emergency department. Patient agrees with MDM, plan.   Luiz Blare, MD 02/08/12 2223

## 2014-01-06 ENCOUNTER — Emergency Department (INDEPENDENT_AMBULATORY_CARE_PROVIDER_SITE_OTHER)
Admission: EM | Admit: 2014-01-06 | Discharge: 2014-01-06 | Disposition: A | Discharge status: Home / Self Care | Payer: Self-pay | Source: Home / Self Care | Attending: Emergency Medicine | Admitting: Emergency Medicine

## 2014-01-06 ENCOUNTER — Encounter (HOSPITAL_COMMUNITY): Payer: Self-pay | Admitting: Emergency Medicine

## 2014-01-06 DIAGNOSIS — J029 Acute pharyngitis, unspecified: Secondary | ICD-10-CM

## 2014-01-06 LAB — POCT RAPID STREP A: Streptococcus, Group A Screen (Direct): NEGATIVE

## 2014-01-06 MED ORDER — METHYLPREDNISOLONE SODIUM SUCC 125 MG IJ SOLR
80.0000 mg | Freq: Once | INTRAMUSCULAR | Status: AC
  Administered 2014-01-06: 80 mg via INTRAMUSCULAR

## 2014-01-06 MED ORDER — PENICILLIN G BENZATHINE 1200000 UNIT/2ML IM SUSP
INTRAMUSCULAR | Status: AC
  Filled 2014-01-06: qty 2

## 2014-01-06 MED ORDER — PENICILLIN G BENZATHINE 1200000 UNIT/2ML IM SUSP
1.2000 10*6.[IU] | Freq: Once | INTRAMUSCULAR | Status: AC
  Administered 2014-01-06: 1.2 10*6.[IU] via INTRAMUSCULAR

## 2014-01-06 MED ORDER — METHYLPREDNISOLONE SODIUM SUCC 125 MG IJ SOLR
INTRAMUSCULAR | Status: AC
  Filled 2014-01-06: qty 2

## 2014-01-06 NOTE — Discharge Instructions (Signed)
Ibuprofen for pain. Salt water gargles and Chloraseptic spray for throat pain.   Pharyngitis Pharyngitis is a sore throat (pharynx). There is redness, pain, and swelling of your throat. HOME CARE   Drink enough fluids to keep your pee (urine) clear or pale yellow.  Only take medicine as told by your doctor.  You may get sick again if you do not take medicine as told. Finish your medicines, even if you start to feel better.  Do not take aspirin.  Rest.  Rinse your mouth (gargle) with salt water ( tsp of salt per 1 qt of water) every 1-2 hours. This will help the pain.  If you are not at risk for choking, you can suck on hard candy or sore throat lozenges. GET HELP IF:  You have large, tender lumps on your neck.  You have a rash.  You cough up green, yellow-brown, or bloody spit. GET HELP RIGHT AWAY IF:   You have a stiff neck.  You drool or cannot swallow liquids.  You throw up (vomit) or are not able to keep medicine or liquids down.  You have very bad pain that does not go away with medicine.  You have problems breathing (not from a stuffy nose). MAKE SURE YOU:   Understand these instructions.  Will watch your condition.  Will get help right away if you are not doing well or get worse. Document Released: 12/15/2007 Document Revised: 04/18/2013 Document Reviewed: 03/05/2013 [facility] Patient Information 2015 [place], [place]. This information is not intended to replace advice given to you by your health care provider. Make sure you discuss any questions you have with your health care provider.  Salt Water Gargle This solution will help make your mouth and throat feel better. HOME CARE INSTRUCTIONS   Mix 1 teaspoon of salt in 8 ounces of warm water.  Gargle with this solution as much or often as you need or as directed. Swish and gargle gently if you have any sores or wounds in your mouth.  Do not swallow this mixture. Document Released: 04/01/2004 Document  Revised: 09/20/2011 Document Reviewed: 08/23/2008 [facility] Patient Information 2015 [place], [place]. This information is not intended to replace advice given to you by your health care provider. Make sure you discuss any questions you have with your health care provider.

## 2014-01-06 NOTE — ED Notes (Signed)
C/o  Sore throat since Saturday.  Redness and swelling of tonsils.  White patches.   Pain with swallowing.  No otc meds taken for symptoms.

## 2014-01-06 NOTE — ED Provider Notes (Signed)
CSN: [id number]     Arrival date & time 01/06/14  0906 History   First MD Initiated Contact with Patient 01/06/14 [phone]     Chief Complaint  Patient presents with  . Sore Throat    Patient is a 33 y.o. female presenting with pharyngitis. The history is provided by the patient.  Sore Throat This is a new problem. The current episode started yesterday. The problem occurs constantly. The problem has been gradually worsening. Pertinent negatives include no chest pain, no abdominal pain, no headaches and no shortness of breath. The symptoms are aggravated by swallowing, eating and drinking. Nothing relieves the symptoms.  Pt reports onset of severe sore throat Friday night that has worsened. States she has been unable to eat or drink a lot d/t throat pain. Denies other URI type symptoms, fever, cough or other associated symptoms. No known sick contacts. Admits to h/o strep throat.   Past Medical History  Diagnosis Date  . Anemia   . Irregular menses   . Superficial thrombophlebitis    Past Surgical History  Procedure Laterality Date  . Tubal ligation    . Ganglion cyst excision     History reviewed. No pertinent family history. History  Substance Use Topics  . Smoking status: Never Smoker   . Smokeless tobacco: Not on file  . Alcohol Use: No   OB History   Grav Para Term Preterm Abortions TAB SAB Ect Mult Living                 Review of Systems  Constitutional: Negative for fever and chills.  HENT: Positive for sore throat and trouble swallowing. Negative for congestion, ear pain, postnasal drip, rhinorrhea, sinus pressure, sneezing and voice change.   Eyes: Negative.   Respiratory: Negative for shortness of breath.   Cardiovascular: Negative for chest pain.  Gastrointestinal: Negative for nausea, vomiting, abdominal pain and diarrhea.  Musculoskeletal: Negative.   Skin: Negative.   Neurological: Negative for headaches.    Allergies  Review of patient's allergies indicates  no known allergies.  Home Medications   Prior to Admission medications   Medication Sig Start Date End Date Taking? Authorizing Provider  acetaminophen (TYLENOL) 500 MG tablet Take 500 mg by mouth every 6 (six) hours as needed.    Historical Provider, MD   BP 107/67  Pulse 86  Temp(Src) 98.1 F (36.7 C) (Oral)  Resp 18  SpO2 99%  LMP 12/17/2013 Physical Exam  Constitutional: She is oriented to person, place, and time. She appears well-developed and well-nourished.  HENT:  Right Ear: Tympanic membrane, external ear and ear canal normal.  Left Ear: Tympanic membrane and ear canal normal.  Nose: Nose normal.  Mouth/Throat: Uvula is midline. Oropharyngeal exudate, posterior oropharyngeal edema and posterior oropharyngeal erythema present. No tonsillar abscesses.  Eyes: Conjunctivae are normal.  Cardiovascular: Normal rate and regular rhythm.   Pulmonary/Chest: Effort normal and breath sounds normal.  Lymphadenopathy:    She has cervical adenopathy.  Neurological: She is alert and oriented to person, place, and time.  Skin: Skin is warm and dry.  Psychiatric: She has a normal mood and affect.    ED Course  Procedures (including critical care time) Labs Review Labs Reviewed  POCT RAPID STREP A ([facility] ONLY)    Imaging Review No results found.   MDM   1. Exudative pharyngitis    PE c/w strep pharyngitis (Rapid Strep neg, will treat clinically for strep). Solumedrol 80 mg IM Bicillin LA 1.2  mil units IM. Salt water gargles Chloraseptic spray    Leanne ChangKatherine P Schorr, NP 01/06/14 [phone]

## 2014-01-07 NOTE — ED Provider Notes (Signed)
Medical screening examination/treatment/procedure(s) were performed by non-physician practitioner and as supervising physician I was immediately available for consultation/collaboration.  Leslee Homeavid Keller, M.D.  Reuben Likesavid C Keller, MD 01/07/14 [phone]

## 2014-01-08 LAB — CULTURE, GROUP A STREP

## 2014-01-17 ENCOUNTER — Ambulatory Visit (HOSPITAL_COMMUNITY): Payer: Medicaid Other | Attending: Family Medicine

## 2014-01-17 ENCOUNTER — Encounter (HOSPITAL_COMMUNITY): Payer: Self-pay | Admitting: Emergency Medicine

## 2014-01-17 ENCOUNTER — Emergency Department (INDEPENDENT_AMBULATORY_CARE_PROVIDER_SITE_OTHER)
Admission: EM | Admit: 2014-01-17 | Discharge: 2014-01-17 | Disposition: A | Discharge status: Home / Self Care | Payer: Medicaid Other | Source: Home / Self Care | Attending: Family Medicine | Admitting: Family Medicine

## 2014-01-17 DIAGNOSIS — R6884 Jaw pain: Secondary | ICD-10-CM | POA: Insufficient documentation

## 2014-01-17 DIAGNOSIS — S0340XA Sprain of jaw, unspecified side, initial encounter: Secondary | ICD-10-CM

## 2014-01-17 MED ORDER — MELOXICAM 7.5 MG PO TABS
7.5000 mg | ORAL_TABLET | Freq: Two times a day (BID) | ORAL | Status: DC
Start: 2014-01-17 — End: 2014-01-24
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
## 2014-01-17 NOTE — Discharge Instructions (Signed)
Soft diet, ice and medicine as needed, see oral surgeon if further problems.
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
## 2014-01-24 ENCOUNTER — Emergency Department (INDEPENDENT_AMBULATORY_CARE_PROVIDER_SITE_OTHER)
Admission: EM | Admit: 2014-01-24 | Discharge: 2014-01-24 | Disposition: A | Discharge status: Home / Self Care | Payer: Medicaid Other | Source: Home / Self Care | Attending: Family Medicine | Admitting: Family Medicine

## 2014-01-24 ENCOUNTER — Encounter (HOSPITAL_COMMUNITY): Payer: Self-pay | Admitting: Emergency Medicine

## 2014-01-24 DIAGNOSIS — M545 Low back pain, unspecified: Secondary | ICD-10-CM

## 2014-01-24 LAB — POCT URINALYSIS DIP (DEVICE)
BILIRUBIN URINE: NEGATIVE
Glucose, UA: NEGATIVE mg/dL
Ketones, ur: NEGATIVE mg/dL
NITRITE: NEGATIVE
PH: 7 (ref 5.0–8.0)
PROTEIN: NEGATIVE mg/dL
Specific Gravity, Urine: 1.02 (ref 1.005–1.030)
UROBILINOGEN UA: 1 mg/dL (ref 0.0–1.0)

## 2014-01-24 LAB — POCT PREGNANCY, URINE: Preg Test, Ur: NEGATIVE

## 2014-01-24 MED ORDER — DICLOFENAC SODIUM 50 MG PO TBEC: 50.0000 mg | DELAYED_RELEASE_TABLET | Freq: Two times a day (BID) | ORAL | Status: DC | PRN

## 2014-01-24 MED ORDER — CYCLOBENZAPRINE HCL 5 MG PO TABS: 5.0000 mg | ORAL_TABLET | Freq: Every evening | ORAL | Status: DC | PRN

## 2014-01-24 NOTE — Discharge Instructions (Signed)
Thank you for coming in today. Come back or go to the emergency room if you notice new weakness new numbness problems walking or bowel or bladder problems. Use a heating pad as needed.    Back Exercises These exercises may help you when beginning to rehabilitate your injury. Your symptoms may resolve with or without further involvement from your physician, physical therapist or athletic trainer. While completing these exercises, remember:   Restoring tissue flexibility helps normal motion to return to the joints. This allows healthier, less painful movement and activity.  An effective stretch should be held for at least 30 seconds.  A stretch should never be painful. You should only feel a gentle lengthening or release in the stretched tissue. STRETCH - Extension, Prone on Elbows   Lie on your stomach on the floor, a bed will be too soft. Place your palms about shoulder width apart and at the height of your head.  Place your elbows under your shoulders. If this is too painful, stack pillows under your chest.  Allow your body to relax so that your hips drop lower and make contact more completely with the floor.  Hold this position for __________ seconds.  Slowly return to lying flat on the floor. Repeat __________ times. Complete this exercise __________ times per day.  RANGE OF MOTION - Extension, Prone Press Ups   Lie on your stomach on the floor, a bed will be too soft. Place your palms about shoulder width apart and at the height of your head.  Keeping your back as relaxed as possible, slowly straighten your elbows while keeping your hips on the floor. You may adjust the placement of your hands to maximize your comfort. As you gain motion, your hands will come more underneath your shoulders.  Hold this position __________ seconds.  Slowly return to lying flat on the floor. Repeat __________ times. Complete this exercise __________ times per day.  RANGE OF MOTION- Quadruped,  Neutral Spine   Assume a hands and knees position on a firm surface. Keep your hands under your shoulders and your knees under your hips. You may place padding under your knees for comfort.  Drop your head and point your tail bone toward the ground below you. This will round out your low back like an angry cat. Hold this position for __________ seconds.  Slowly lift your head and release your tail bone so that your back sags into a large arch, like an old horse.  Hold this position for __________ seconds.  Repeat this until you feel limber in your low back.  Now, find your "sweet spot." This will be the most comfortable position somewhere between the two previous positions. This is your neutral spine. Once you have found this position, tense your stomach muscles to support your low back.  Hold this position for __________ seconds. Repeat __________ times. Complete this exercise __________ times per day.  STRETCH - Flexion, Single Knee to Chest   Lie on a firm bed or floor with both legs extended in front of you.  Keeping one leg in contact with the floor, bring your opposite knee to your chest. Hold your leg in place by either grabbing behind your thigh or at your knee.  Pull until you feel a gentle stretch in your low back. Hold __________ seconds.  Slowly release your grasp and repeat the exercise with the opposite side. Repeat __________ times. Complete this exercise __________ times per day.  STRETCH - Hamstrings, Standing  Stand or  sit and extend your right / left leg, placing your foot on a chair or foot stool  Keeping a slight arch in your low back and your hips straight forward.  Lead with your chest and lean forward at the waist until you feel a gentle stretch in the back of your right / left knee or thigh. (When done correctly, this exercise requires leaning only a small distance.)  Hold this position for __________ seconds. Repeat __________ times. Complete this stretch  __________ times per day. STRENGTHENING - Deep Abdominals, Pelvic Tilt   Lie on a firm bed or floor. Keeping your legs in front of you, bend your knees so they are both pointed toward the ceiling and your feet are flat on the floor.  Tense your lower abdominal muscles to press your low back into the floor. This motion will rotate your pelvis so that your tail bone is scooping upwards rather than pointing at your feet or into the floor.  With a gentle tension and even breathing, hold this position for __________ seconds. Repeat __________ times. Complete this exercise __________ times per day.  STRENGTHENING - Abdominals, Crunches   Lie on a firm bed or floor. Keeping your legs in front of you, bend your knees so they are both pointed toward the ceiling and your feet are flat on the floor. Cross your arms over your chest.  Slightly tip your chin down without bending your neck.  Tense your abdominals and slowly lift your trunk high enough to just clear your shoulder blades. Lifting higher can put excessive stress on the low back and does not further strengthen your abdominal muscles.  Control your return to the starting position. Repeat __________ times. Complete this exercise __________ times per day.  STRENGTHENING - Quadruped, Opposite UE/LE Lift   Assume a hands and knees position on a firm surface. Keep your hands under your shoulders and your knees under your hips. You may place padding under your knees for comfort.  Find your neutral spine and gently tense your abdominal muscles so that you can maintain this position. Your shoulders and hips should form a rectangle that is parallel with the floor and is not twisted.  Keeping your trunk steady, lift your right hand no higher than your shoulder and then your left leg no higher than your hip. Make sure you are not holding your breath. Hold this position __________ seconds.  Continuing to keep your abdominal muscles tense and your back  steady, slowly return to your starting position. Repeat with the opposite arm and leg. Repeat __________ times. Complete this exercise __________ times per day. Document Released: 07/16/2005 Document Revised: 09/20/2011 Document Reviewed: 10/10/2008 [facility] Patient Information 2015 [place], [place]. This information is not intended to replace advice given to you by your health care provider. Make sure you discuss any questions you have with your health care provider.

## 2014-01-24 NOTE — ED Provider Notes (Signed)
Claudia Washington is a 33 y.o. female who presents to Urgent Care today for back pain. Patient has any to 3 day history of left low back pain. However she notes mild low back pain occurring for the past several weeks. She denies any radiating pain weakness or numbness. The pain is moderate and worse with flexion and extension. She denies any difficulty walking or bowel bladder dysfunction. She denies any injury or falls. She has not tried any medications yet. No urinary symptoms.   Past Medical History  Diagnosis Date  . Anemia   . Irregular menses   . Superficial thrombophlebitis    History  Substance Use Topics  . Smoking status: Never Smoker   . Smokeless tobacco: Not on file  . Alcohol Use: No   ROS as above Medications: No current facility-administered medications for this encounter.   Current Outpatient Prescriptions  Medication Sig Dispense Refill  . cyclobenzaprine (FLEXERIL) 5 MG tablet Take 1 tablet (5 mg total) by mouth at bedtime as needed for muscle spasms.  20 tablet  0  . diclofenac (VOLTAREN) 50 MG EC tablet Take 1 tablet (50 mg total) by mouth 2 (two) times daily as needed.  30 tablet  0    Exam:  BP 109/74  Pulse 76  Temp(Src) 99 F (37.2 C) (Oral)  Resp 16  SpO2 99%  LMP 12/17/2013 Gen: Well NAD HEENT: EOMI,  MMM Lungs: Normal work of breathing. CTABL Heart: RRR no MRG Abd: NABS, Soft. Nontender, Nondistended Exts: Brisk capillary refill, warm and well perfused.  Back: Nontender to spinal midline. Tender palpation left lumbar paraspinal Normal flexion decreased extension due to pain Negative straight leg raise test and Faber test bilaterally. Reflexes are equal and normal at the bilateral knees and ankles Largely strength is intact Normal gait   Results for orders placed during the hospital encounter of 01/24/14 (from the past 24 hour(s))  POCT URINALYSIS DIP (DEVICE)     Status: Abnormal   Collection Time    01/24/14 10:36 AM      Result Value Ref  Range   Glucose, UA NEGATIVE  NEGATIVE mg/dL   Bilirubin Urine NEGATIVE  NEGATIVE   Ketones, ur NEGATIVE  NEGATIVE mg/dL   Specific Gravity, Urine 1.020  1.005 - 1.030   Hgb urine dipstick TRACE (*) NEGATIVE   pH 7.0  5.0 - 8.0   Protein, ur NEGATIVE  NEGATIVE mg/dL   Urobilinogen, UA 1.0  0.0 - 1.0 mg/dL   Nitrite NEGATIVE  NEGATIVE   Leukocytes, UA TRACE (*) NEGATIVE  POCT PREGNANCY, URINE     Status: None   Collection Time    01/24/14 10:41 AM      Result Value Ref Range   Preg Test, Ur NEGATIVE  NEGATIVE   No results found.  Assessment and Plan: 33 y.o. female with lumbago of the lumbar spine. Plan to treat with diclofenac Flexeril and home exercise program.  Discussed warning signs or symptoms. Please see discharge instructions. Patient expresses understanding.   This note was created using Conservation officer, historic buildingsDragon voice recognition software. Any transcription errors are unintended.    Rodolph BongEvan S Corey, MD 01/24/14 1131
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
## 2014-08-08 ENCOUNTER — Inpatient Hospital Stay (HOSPITAL_COMMUNITY)
Admission: AD | Admit: 2014-08-08 | Discharge: 2014-08-08 | Disposition: A | Discharge status: Home / Self Care | Payer: Medicaid Other | Source: Ambulatory Visit | Attending: Obstetrics & Gynecology | Admitting: Obstetrics & Gynecology

## 2014-08-08 ENCOUNTER — Encounter (HOSPITAL_COMMUNITY): Payer: Self-pay | Admitting: *Deleted

## 2014-08-08 DIAGNOSIS — A5901 Trichomonal vulvovaginitis: Secondary | ICD-10-CM | POA: Diagnosis not present

## 2014-08-08 DIAGNOSIS — N72 Inflammatory disease of cervix uteri: Secondary | ICD-10-CM | POA: Diagnosis not present

## 2014-08-08 DIAGNOSIS — N939 Abnormal uterine and vaginal bleeding, unspecified: Secondary | ICD-10-CM | POA: Diagnosis present

## 2014-08-08 LAB — URINALYSIS, ROUTINE W REFLEX MICROSCOPIC
Bilirubin Urine: NEGATIVE
Glucose, UA: NEGATIVE mg/dL
KETONES UR: NEGATIVE mg/dL
NITRITE: NEGATIVE
Protein, ur: NEGATIVE mg/dL
Specific Gravity, Urine: 1.015 (ref 1.005–1.030)
UROBILINOGEN UA: 0.2 mg/dL (ref 0.0–1.0)
pH: 8.5 — ABNORMAL HIGH (ref 5.0–8.0)

## 2014-08-08 LAB — POCT PREGNANCY, URINE: PREG TEST UR: NEGATIVE

## 2014-08-08 LAB — WET PREP, GENITAL
CLUE CELLS WET PREP: NONE SEEN
YEAST WET PREP: NONE SEEN

## 2014-08-08 LAB — URINE MICROSCOPIC-ADD ON

## 2014-08-08 MED ORDER — AZITHROMYCIN 250 MG PO TABS
1000.0000 mg | ORAL_TABLET | Freq: Once | ORAL | Status: AC
  Administered 2014-08-08: 1000 mg via ORAL
  Filled 2014-08-08: qty 4

## 2014-08-08 MED ORDER — METRONIDAZOLE 500 MG PO TABS: 500.0000 mg | ORAL_TABLET | Freq: Two times a day (BID) | ORAL | Status: DC

## 2014-08-08 NOTE — MAU Note (Signed)
Hx of irregular periods; hx of BTL; c/o having bleeding with intercourse for past 2 weeks; " like a light period" with some cramping;

## 2014-08-08 NOTE — MAU Note (Signed)
Pt reports she has been having vag bleeding during intercourse.Having some pain with deeper penetration.

## 2014-08-08 NOTE — Discharge Instructions (Signed)
Be sure your sex partner is treated for the trichomonas infection. Men do not usually have symptoms and if not treated will re infect their partner. No sex for one week after you are both treated. Follow up with the [facility] Clinic for your pap smear. Call for appointment.
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
## 2014-08-09 LAB — RPR: RPR Ser Ql: NONREACTIVE

## 2014-08-09 LAB — HIV ANTIBODY (ROUTINE TESTING W REFLEX): HIV Screen 4th Generation wRfx: NONREACTIVE

## 2014-08-09 LAB — GC/CHLAMYDIA PROBE AMP (~~LOC~~) NOT AT ARMC
CHLAMYDIA, DNA PROBE: NEGATIVE
Neisseria Gonorrhea: NEGATIVE

## 2015-02-07 ENCOUNTER — Emergency Department (HOSPITAL_COMMUNITY)
Admission: EM | Admit: 2015-02-07 | Discharge: 2015-02-07 | Disposition: A | Discharge status: Home / Self Care | Payer: Medicaid Other | Attending: Emergency Medicine | Admitting: Emergency Medicine

## 2015-02-07 ENCOUNTER — Encounter (HOSPITAL_COMMUNITY): Payer: Self-pay | Admitting: Emergency Medicine

## 2015-02-07 ENCOUNTER — Emergency Department (HOSPITAL_COMMUNITY): Payer: Medicaid Other

## 2015-02-07 DIAGNOSIS — Z8742 Personal history of other diseases of the female genital tract: Secondary | ICD-10-CM | POA: Diagnosis not present

## 2015-02-07 DIAGNOSIS — M545 Low back pain, unspecified: Secondary | ICD-10-CM

## 2015-02-07 DIAGNOSIS — Z8672 Personal history of thrombophlebitis: Secondary | ICD-10-CM | POA: Diagnosis not present

## 2015-02-07 DIAGNOSIS — Z862 Personal history of diseases of the blood and blood-forming organs and certain disorders involving the immune mechanism: Secondary | ICD-10-CM | POA: Diagnosis not present

## 2015-02-07 LAB — I-STAT BETA HCG BLOOD, ED (MC, WL, AP ONLY): I-stat hCG, quantitative: <5 m[IU]/mL (ref <5)

## 2015-02-07 MED ORDER — CYCLOBENZAPRINE HCL 10 MG PO TABS: 10.0000 mg | ORAL_TABLET | Freq: Two times a day (BID) | ORAL | Status: DC | PRN

## 2015-02-07 MED ORDER — HYDROMORPHONE HCL 1 MG/ML IJ SOLN
1.0000 mg | Freq: Once | INTRAMUSCULAR | Status: AC
  Administered 2015-02-07: 1 mg via INTRAMUSCULAR
  Filled 2015-02-07: qty 1

## 2015-02-07 MED ORDER — KETOROLAC TROMETHAMINE 60 MG/2ML IM SOLN
60.0000 mg | Freq: Once | INTRAMUSCULAR | Status: AC
  Administered 2015-02-07: 60 mg via INTRAMUSCULAR
  Filled 2015-02-07: qty 2

## 2015-02-07 MED ORDER — NAPROXEN 500 MG PO TABS: 500.0000 mg | ORAL_TABLET | Freq: Two times a day (BID) | ORAL | Status: DC

## 2015-02-07 MED ORDER — CYCLOBENZAPRINE HCL 10 MG PO TABS
10.0000 mg | ORAL_TABLET | Freq: Once | ORAL | Status: AC
  Administered 2015-02-07: 10 mg via ORAL
  Filled 2015-02-07: qty 1

## 2015-02-07 NOTE — ED Notes (Addendum)
Per EMS pt hx of chronic lower back pain; acute/chronic worsening of pain 2 days ago; denies injury or associated symptoms related to current pain; has not attempted over the counter medications for pain prior to arrival. Ambulatory without difficulty walking.

## 2015-02-07 NOTE — ED Notes (Signed)
Bed: WA03 Expected date:  Expected time:  Means of arrival:  Comments: EMS- 33yo F, back pain x 2 days

## 2015-02-07 NOTE — Discharge Instructions (Signed)

## 2015-02-07 NOTE — ED Provider Notes (Signed)
CSN: [id number]     Arrival date & time 02/07/15  4098 History   First MD Initiated Contact with Patient 02/07/15 0900     Chief Complaint  Patient presents with  . Back Pain     (Consider location/radiation/quality/duration/timing/severity/associated sxs/prior Treatment) Patient is a 34 y.o. female presenting with back pain.  Back Pain Location:  Lumbar spine Quality:  Aching Radiates to: to left side. Pain severity:  Severe Onset quality:  Gradual Duration:  2 days (bp for year, however 2 days ago worse) Timing:  Constant Progression:  Worsening Chronicity:  Recurrent (1 year of back pain) Context: not falling, not jumping from heights, not lifting heavy objects and not recent injury   Relieved by:  Nothing Worsened by:  Movement and palpation (there all the time including rest) Ineffective treatments:  None tried Associated symptoms: no abdominal pain, no bladder incontinence, no bowel incontinence, no chest pain, no fever, no headaches, no paresthesias, no pelvic pain, no perianal numbness, no tingling and no weakness   Risk factors: no hx of cancer and no steroid use     Past Medical History  Diagnosis Date  . Anemia   . Irregular menses   . Superficial thrombophlebitis    Past Surgical History  Procedure Laterality Date  . Tubal ligation    . Ganglion cyst excision     Family History  Problem Relation Age of Onset  . Hypertension Mother   . Diabetes Maternal Grandmother   . Depression Paternal Grandmother    History  Substance Use Topics  . Smoking status: Never Smoker   . Smokeless tobacco: Not on file  . Alcohol Use: No   OB History    Gravida Para Term Preterm AB TAB SAB Ectopic Multiple Living   5 5 4 1      5      Review of Systems  Constitutional: Negative for fever.  HENT: Negative for sore throat.   Eyes: Negative for visual disturbance.  Respiratory: Negative for cough and shortness of breath.   Cardiovascular: Negative for chest pain.   Gastrointestinal: Negative for nausea, vomiting, abdominal pain, diarrhea, constipation and bowel incontinence.  Genitourinary: Negative for bladder incontinence, difficulty urinating and pelvic pain.  Musculoskeletal: Positive for back pain. Negative for neck pain.  Skin: Negative for rash.  Neurological: Negative for tingling, syncope, weakness, headaches and paresthesias.      Allergies  Review of patient's allergies indicates no known allergies.  Home Medications   Prior to Admission medications   Medication Sig Start Date End Date Taking? Authorizing Provider  cyclobenzaprine (FLEXERIL) 10 MG tablet Take 1 tablet (10 mg total) by mouth 2 (two) times daily as needed for muscle spasms. 02/07/15   Alvira Monday, MD  metroNIDAZOLE (FLAGYL) 500 MG tablet Take 1 tablet (500 mg total) by mouth 2 (two) times daily. Patient not taking: Reported on 02/07/2015 08/08/14   Janne Napoleon, NP  naproxen (NAPROSYN) 500 MG tablet Take 1 tablet (500 mg total) by mouth 2 (two) times daily. 02/07/15   Alvira Monday, MD   BP 101/63 mmHg  Pulse 62  Temp(Src) 97.8 F (36.6 C) (Oral)  Resp 16  SpO2 100%  LMP 02/03/2015 Physical Exam  Constitutional: She is oriented to person, place, and time. She appears well-developed and well-nourished. No distress.  HENT:  Head: Normocephalic and atraumatic.  Eyes: Conjunctivae and EOM are normal.  Neck: Normal range of motion.  Cardiovascular: Normal rate, regular rhythm, normal heart sounds and intact distal pulses.  Exam reveals no gallop and no friction rub.   No murmur heard. Pulmonary/Chest: Effort normal and breath sounds normal. No respiratory distress. She has no wheezes. She has no rales.  Abdominal: Soft. She exhibits no distension. There is no tenderness. There is no guarding.  Musculoskeletal: She exhibits no edema.       Cervical back: She exhibits no tenderness.       Thoracic back: She exhibits no tenderness and no bony tenderness.        Lumbar back: She exhibits tenderness and bony tenderness.  Neurological: She is alert and oriented to person, place, and time. She has normal strength. No sensory deficit. GCS eye subscore is 4. GCS verbal subscore is 5. GCS motor subscore is 6.  Skin: Skin is warm and dry. No rash noted. She is not diaphoretic. No erythema.  Nursing note and vitals reviewed.   ED Course  Procedures (including critical care time) Labs Review Labs Reviewed  I-STAT BETA HCG BLOOD, ED ([facility], [facility], [facility] ONLY)    Imaging Review Dg Lumbar Spine 2-3 Views  02/07/2015   CLINICAL DATA:  Back pain for 1 year.  EXAM: LUMBAR SPINE - 2-3 VIEW  COMPARISON:  None.  FINDINGS: There is no evidence of lumbar spine fracture. Alignment is normal. Intervertebral disc spaces are maintained.  IMPRESSION: Negative.   Electronically Signed   By: Elige Ko   On: 02/07/2015 10:54     EKG Interpretation None      MDM   Final diagnoses:  Midline low back pain without sciatica   34 year old female with no significant medical history presents with concern of back pain which she reports has been there every day for the past year with worsening in the past 2 days. Patient has a normal neurologic exam and denies any urinary retention or overflow incontinence, stool incontinence, saddle anesthesia, fever, IV drug use, trauma, chronic steroid use or immunocompromise and have low suspicion suspicion for cauda equina, fracture, epidural abscess, or vertebral osteomyelitis.  However, given the duration of patient's back pain for 1 year without any prior imaging will order a screening x-ray to evaluate for bony lesions.  XR shows no acute findings.  Discussed with patient given the duration of her symptoms would recommend her obtaining a PCP and potentially further outpatient evaluation.  Patient given intramuscular Dilaudid and Toradol as well as Flexeril for pain. She is discharged with a prescription for naproxen and Flexeril. Patient  discharged in stable condition with understanding of reasons to return.   Alvira Monday, MD 02/07/15 2106

## 2015-02-07 NOTE — ED Notes (Addendum)
Patient states her back is tense and she has trouble walking at times.  She is not able to bend over and pick items without having pain. Patient states she hasn't fallen or had any injuries.

## 2015-02-07 NOTE — ED Notes (Signed)
Patient transported to X-ray 

## 2015-10-15 ENCOUNTER — Encounter (HOSPITAL_COMMUNITY): Payer: Self-pay | Admitting: *Deleted

## 2015-10-15 ENCOUNTER — Emergency Department (HOSPITAL_COMMUNITY): Payer: Medicaid Other

## 2015-10-15 ENCOUNTER — Emergency Department (HOSPITAL_COMMUNITY)
Admission: EM | Admit: 2015-10-15 | Discharge: 2015-10-15 | Disposition: A | Discharge status: Home / Self Care | Payer: Medicaid Other | Attending: Emergency Medicine | Admitting: Emergency Medicine

## 2015-10-15 DIAGNOSIS — M25552 Pain in left hip: Secondary | ICD-10-CM

## 2015-10-15 DIAGNOSIS — Z862 Personal history of diseases of the blood and blood-forming organs and certain disorders involving the immune mechanism: Secondary | ICD-10-CM | POA: Diagnosis not present

## 2015-10-15 DIAGNOSIS — R269 Unspecified abnormalities of gait and mobility: Secondary | ICD-10-CM | POA: Diagnosis not present

## 2015-10-15 DIAGNOSIS — Z8679 Personal history of other diseases of the circulatory system: Secondary | ICD-10-CM | POA: Insufficient documentation

## 2015-10-15 DIAGNOSIS — Z8742 Personal history of other diseases of the female genital tract: Secondary | ICD-10-CM | POA: Diagnosis not present

## 2015-10-15 DIAGNOSIS — M79652 Pain in left thigh: Secondary | ICD-10-CM | POA: Diagnosis not present

## 2015-10-15 DIAGNOSIS — Z791 Long term (current) use of non-steroidal anti-inflammatories (NSAID): Secondary | ICD-10-CM | POA: Insufficient documentation

## 2015-10-15 MED ORDER — ONDANSETRON 4 MG PO TBDP
4.0000 mg | ORAL_TABLET | Freq: Once | ORAL | Status: AC
  Administered 2015-10-15: 4 mg via ORAL
  Filled 2015-10-15: qty 1

## 2015-10-15 MED ORDER — IBUPROFEN 800 MG PO TABS: 800.0000 mg | ORAL_TABLET | Freq: Three times a day (TID) | ORAL | Status: DC

## 2015-10-15 MED ORDER — HYDROCODONE-ACETAMINOPHEN 5-325 MG PO TABS
2.0000 | ORAL_TABLET | Freq: Once | ORAL | Status: AC
  Administered 2015-10-15: 2 via ORAL
  Filled 2015-10-15: qty 2

## 2015-10-15 NOTE — Discharge Instructions (Signed)
Use the crutches as needed. Take the pain medicine prescribed as needed. Take ibuprofen with food. Call Dr. Ophelia CharterYates to schedule appointment if having significant pain or difficulty walking by next week. Ice pack on your left hip 4 times daily 30 minutes at a time

## 2015-10-15 NOTE — ED Notes (Signed)
Pt c/o L leg pain x 4 days.  Sts she has been taking OTC medications w/o relief.  NAD noted.  Sts "I know when something is wrong w/ my body and I need to come to the hospital."

## 2015-10-15 NOTE — ED Notes (Signed)
Per EMS, pt from home, pt from home, states pt was sliding herself on the bed to get out, when she felt a pain in her L leg and L hip.  Pt reports pressure pain.  Pt is ambulatory to the stretcher.

## 2015-10-15 NOTE — ED Notes (Signed)
PT DISCHARGED. INSTRUCTIONS AND PRESCRIPTION GIVEN. AAOX3. PT IN NO APPARENT DISTRESS. THE OPPORTUNITY TO ASK QUESTIONS WAS PROVIDED. 

## 2015-10-15 NOTE — ED Notes (Signed)
Bed: WA08 Expected date:  Expected time:  Means of arrival:  Comments: Ems-back pain,cant walk

## 2015-10-15 NOTE — ED Provider Notes (Signed)
CSN: [id number]     Arrival date & time 10/15/15  1046 History   First MD Initiated Contact with Patient 10/15/15 1144     Chief Complaint  Patient presents with  . Leg Pain  . Hip Pain     (Consider location/radiation/quality/duration/timing/severity/associated sxs/prior Treatment) HPI Complains of left hip pain radiating to left lateral thigh sudden onset 4 days ago when she slid forward in the bed she did not fall. . Pain is worse with movement and improved with remaining still. She has been walking with a limp since the event and feels as if her thigh is swollen. No other associated symptoms. She treated herself with over-the-counter ibuprofen without relief. No fever. No other associated symptoms Past Medical History  Diagnosis Date  . Anemia   . Irregular menses   . Superficial thrombophlebitis    Past Surgical History  Procedure Laterality Date  . Tubal ligation    . Ganglion cyst excision     Family History  Problem Relation Age of Onset  . Hypertension Mother   . Diabetes Maternal Grandmother   . Depression Paternal Grandmother    Social History  Substance Use Topics  . Smoking status: Never Smoker   . Smokeless tobacco: None  . Alcohol Use: No   OB History    Gravida Para Term Preterm AB TAB SAB Ectopic Multiple Living   Review of Systems  Constitutional: Negative.   HENT: Negative.   Respiratory: Negative.   Cardiovascular: Negative.   Gastrointestinal: Negative.   Musculoskeletal: Positive for arthralgias and gait problem.       Left hip and left thigh pain and walks with limp  Skin: Negative.   Neurological: Negative.   Psychiatric/Behavioral: Negative.   All other systems reviewed and are negative.     Allergies  Review of patient's allergies indicates no known allergies.  Home Medications   Prior to Admission medications   Medication Sig Start Date End Date Taking? Authorizing Provider  cyclobenzaprine (FLEXERIL) 10 MG  tablet Take 1 tablet (10 mg total) by mouth 2 (two) times daily as needed for muscle spasms. 02/07/15   Alvira Monday, MD  metroNIDAZOLE (FLAGYL) 500 MG tablet Take 1 tablet (500 mg total) by mouth 2 (two) times daily. Patient not taking: Reported on 02/07/2015 08/08/14   Janne Napoleon, NP  naproxen (NAPROSYN) 500 MG tablet Take 1 tablet (500 mg total) by mouth 2 (two) times daily. 02/07/15   Alvira Monday, MD   BP 122/75 mmHg  Pulse 59  Temp(Src) 98.2 F (36.8 C) (Oral)  Resp 16  SpO2 100% Physical Exam  Constitutional: She is oriented to person, place, and time. She appears well-developed and well-nourished.  Appears mildly uncomfortable  HENT:  Head: Normocephalic and atraumatic.  Eyes: Conjunctivae are normal. Pupils are equal, round, and reactive to light.  Neck: Neck supple. No tracheal deviation present. No thyromegaly present.  Cardiovascular: Normal rate and regular rhythm.   No murmur heard. Pulmonary/Chest: Effort normal and breath sounds normal.  Abdominal: Soft. Bowel sounds are normal. She exhibits no distension. There is no tenderness.  Musculoskeletal: Normal range of motion. She exhibits no edema or tenderness.  Tender at left hip. No pain on internal or external rotation of thigh.. No redness or swelling. DP pulse 2+. Limited range of motion ofhip secondary to pain  Neurological: She is alert and oriented to person, place, and time. Coordination normal.  Skin: Skin is warm and dry. No rash noted.  Psychiatric: She has a normal mood and affect.  Nursing note and vitals reviewed.   ED Course  Procedures (including critical care time) Labs Review Labs Reviewed - No data to display  Imaging Review No results found. I have personally reviewed and evaluated these images and lab results as part of my medical decision-making.   EKG Interpretation None     Patient states pain not improved after treatment with Norco. She vomited after treatment with Norco she prefers  ibuprofen here at 4:30 PM she states her pain is well-controlled at while walking with crutches. Results for orders placed or performed during the hospital encounter of 02/07/15  I-Stat Beta hCG blood, ED ([facility], [facility], [facility] only)  Result Value Ref Range   I-stat hCG, quantitative <5.0 <5 mIU/mL   Comment 3           Ct Hip Left Wo Contrast  10/15/2015  CLINICAL DATA:  Atraumatic left hip pain EXAM: CT OF THE LEFT HIP WITHOUT CONTRAST TECHNIQUE: Multidetector CT imaging of the left hip was performed according to the standard protocol. Multiplanar CT image reconstructions were also generated. COMPARISON:  None. FINDINGS: There is no acute fracture or dislocation. There is no lytic or sclerotic osseous lesion. There is no periosteal reaction or bone destruction. The joint space is maintained. The muscles are normal. There is no muscle atrophy. There is no fluid collection or hematoma. IMPRESSION: No acute osseous injury of the left hip. Electronically Signed   By: Elige KoHetal  Patel   On: 10/15/2015 13:30    MDM  Plan prescription ibuprofen. Referral Dr. Ophelia Charteryates. If not back to normal by next week I feel the patient has strained her left hip Final diagnoses:  None   CT scan shows no abnormality Diagnosis left hip pain     Doug SouSam Derrico Zhong, MD 10/15/15 1645

## 2015-10-21 ENCOUNTER — Emergency Department (HOSPITAL_COMMUNITY)
Admission: EM | Admit: 2015-10-21 | Discharge: 2015-10-21 | Disposition: A | Discharge status: Home / Self Care | Payer: Medicaid Other | Attending: Emergency Medicine | Admitting: Emergency Medicine

## 2015-10-21 ENCOUNTER — Encounter (HOSPITAL_COMMUNITY): Payer: Self-pay | Admitting: Oncology

## 2015-10-21 DIAGNOSIS — Z8742 Personal history of other diseases of the female genital tract: Secondary | ICD-10-CM | POA: Diagnosis not present

## 2015-10-21 DIAGNOSIS — Z8679 Personal history of other diseases of the circulatory system: Secondary | ICD-10-CM | POA: Diagnosis not present

## 2015-10-21 DIAGNOSIS — M545 Low back pain: Secondary | ICD-10-CM | POA: Diagnosis present

## 2015-10-21 DIAGNOSIS — Z862 Personal history of diseases of the blood and blood-forming organs and certain disorders involving the immune mechanism: Secondary | ICD-10-CM | POA: Insufficient documentation

## 2015-10-21 DIAGNOSIS — M79652 Pain in left thigh: Secondary | ICD-10-CM | POA: Diagnosis not present

## 2015-10-21 DIAGNOSIS — M25552 Pain in left hip: Secondary | ICD-10-CM | POA: Insufficient documentation

## 2015-10-21 DIAGNOSIS — M5432 Sciatica, left side: Secondary | ICD-10-CM | POA: Diagnosis not present

## 2015-10-21 MED ORDER — IBUPROFEN 800 MG PO TABS
800.0000 mg | ORAL_TABLET | Freq: Once | ORAL | Status: AC
  Administered 2015-10-21: 800 mg via ORAL
  Filled 2015-10-21: qty 1

## 2015-10-21 MED ORDER — HYDROCODONE-ACETAMINOPHEN 5-325 MG PO TABS
2.0000 | ORAL_TABLET | Freq: Once | ORAL | Status: AC
  Administered 2015-10-21: 2 via ORAL
  Filled 2015-10-21: qty 2

## 2015-10-21 MED ORDER — CYCLOBENZAPRINE HCL 10 MG PO TABS
10.0000 mg | ORAL_TABLET | Freq: Once | ORAL | Status: AC
  Administered 2015-10-21: 10 mg via ORAL
  Filled 2015-10-21: qty 1

## 2015-10-21 NOTE — ED Provider Notes (Signed)
CSN: [id number]     Arrival date & time 10/21/15  0243 History   First MD Initiated Contact with Patient 10/21/15 0525     Chief Complaint  Patient presents with  . Leg Pain     (Consider location/radiation/quality/duration/timing/severity/associated sxs/prior Treatment) Patient is a 35 y.o. female presenting with leg pain. The history is provided by the patient.  Leg Pain She Complains of pain in her left lumbar area extending down into the left hip and proximal thigh. Pain started about one week ago when she slid forward in her bed without falling. She was seen in emergency and had CT of her left hip which was reported to be unremarkable and she was sent home with crutches and prescription for ibuprofen and referred to orthopedics for follow-up. She has been taking over-the-counter ibuprofen instead of the prescription but does not seem to help. She is complaining of worsening pain which is worse with any movement. She is tried applying ice and heat with little if any relief. She denies any weakness or numbness or tingling and denies any bowel or bladder dysfunction. She denies fever or chills. She did make an appointment with an orthopedic physician and is scheduled to see him at 9:45 AM today.  Past Medical History  Diagnosis Date  . Anemia   . Irregular menses   . Superficial thrombophlebitis    Past Surgical History  Procedure Laterality Date  . Tubal ligation    . Ganglion cyst excision     Family History  Problem Relation Age of Onset  . Hypertension Mother   . Diabetes Maternal Grandmother   . Depression Paternal Grandmother    Social History  Substance Use Topics  . Smoking status: Never Smoker   . Smokeless tobacco: None  . Alcohol Use: No   OB History    Gravida Para Term Preterm AB TAB SAB Ectopic Multiple Living   Review of Systems  All other systems reviewed and are negative.     Allergies  Review of patient's allergies indicates no  known allergies.  Home Medications   Prior to Admission medications   Medication Sig Start Date End Date Taking? Authorizing Provider  acetaminophen (TYLENOL) 500 MG tablet Take 1,500 mg by mouth every 6 (six) hours as needed for mild pain, moderate pain, fever or headache.   Yes Historical Provider, MD  ibuprofen (ADVIL,MOTRIN) 200 MG tablet Take 200 mg by mouth every 6 (six) hours as needed for moderate pain.   Yes Historical Provider, MD  ibuprofen (ADVIL,MOTRIN) 800 MG tablet Take 1 tablet (800 mg total) by mouth 3 (three) times daily. Patient not taking: Reported on 10/21/2015 10/15/15   Doug Sou, MD   BP 110/76 mmHg  Pulse 70  Temp(Src) 98 F (36.7 C) (Oral)  Resp 16  Ht  (1.626 m)  Wt 140 lb (63.504 kg)  BMI 24.02 kg/m2  SpO2 100%  LMP 09/17/2015 Physical Exam  Nursing note and vitals reviewed.  35 year old female, resting comfortably and in no acute distress. Vital signs are normal. Oxygen saturation is 100%, which is normal. Head is normocephalic and atraumatic. PERRLA, EOMI. Oropharynx is clear. Neck is nontender and supple without adenopathy or JVD. Back is moderately tender in the mid and lower lumbar area with significant bilateral paralumbar spasm. Straight leg raise is positive on the left at 45. There is no CVA tenderness. Lungs are clear without rales,  wheezes, or rhonchi. Chest is nontender. Heart has regular rate and rhythm without murmur. Abdomen is soft, flat, nontender without masses or hepatosplenomegaly and peristalsis is normoactive. Extremities have no cyanosis or edema, full range of motion is present. There is full internal and external rotation of the left hip and I can fully flex the hip with the knee flexed. There is tenderness to palpation from about the mid thigh proximal to the left paralumbar area. Skin is warm and dry without rash. Neurologic: Mental status is normal, cranial nerves are intact, there are no motor or sensory deficits.  Strength of plantarflexion and dorsiflexion of the feet is symmetric and there are no sensory deficits on the feet or legs.  ED Course  Procedures (including critical care time)  MDM   Final diagnoses:  Left sided sciatica    Pain in the left lower back, left hip, left thigh. Pain appears to be in a sciatic distribution. No fever and no history of surgery or lumbar puncture to indicate she would be at risk for condition such as discitis or spinal epidural abscess. Old records were reviewed confirming ED visit 1 week ago with negative CT of the left hip. She does have an appointment with her orthopedic surgeon in a few hours. I will give her medication to get some symptomatic relief until she can be evaluated by her orthopedic surgeon. I do not see indication for additional imaging at this point. She is given a dose of hydrocodone-acetaminophen, cyclobenzaprine, and ibuprofen.  Following above-noted treatment, she did have significant relief of pain. She is discharged to keep her appointment with her orthopedic surgeon at 9:45 AM today.  Dione Boozeavid Tamikia Chowning, MD 10/21/15 [phone]

## 2015-10-21 NOTE — ED Notes (Signed)
Per EMS pt c/o left leg pain x 1 week, seen here for the same one week ago.  Per EMS no obvious deformity or swelling.  Denies injury to leg.

## 2015-10-21 NOTE — Discharge Instructions (Signed)
Go f or your appointment at 9:45 AM today, as scheduled.  Sciatica Sciatica is pain, weakness, numbness, or tingling along the path of the sciatic nerve. The nerve starts in the lower back and runs down the back of each leg. The nerve controls the muscles in the lower leg and in the back of the knee, while also providing sensation to the back of the thigh, lower leg, and the sole of your foot. Sciatica is a symptom of another medical condition. For instance, nerve damage or certain conditions, such as a herniated disk or bone spur on the spine, pinch or put pressure on the sciatic nerve. This causes the pain, weakness, or other sensations normally associated with sciatica. Generally, sciatica only affects one side of the body. CAUSES   Herniated or slipped disc.  Degenerative disk disease.  A pain disorder involving the narrow muscle in the buttocks (piriformis syndrome).  Pelvic injury or fracture.  Pregnancy.  Tumor (rare). SYMPTOMS  Symptoms can vary from mild to very severe. The symptoms usually travel from the low back to the buttocks and down the back of the leg. Symptoms can include:  Mild tingling or dull aches in the lower back, leg, or hip.  Numbness in the back of the calf or sole of the foot.  Burning sensations in the lower back, leg, or hip.  Sharp pains in the lower back, leg, or hip.  Leg weakness.  Severe back pain inhibiting movement. These symptoms may get worse with coughing, sneezing, laughing, or prolonged sitting or standing. Also, being overweight may worsen symptoms. DIAGNOSIS  Your caregiver will perform a physical exam to look for common symptoms of sciatica. He or she may ask you to do certain movements or activities that would trigger sciatic nerve pain. Other tests may be performed to find the cause of the sciatica. These may include:  Blood tests.  X-rays.  Imaging tests, such as an MRI or CT scan. TREATMENT  Treatment is directed at the cause  of the sciatic pain. Sometimes, treatment is not necessary and the pain and discomfort goes away on its own. If treatment is needed, your caregiver may suggest:  Over-the-counter medicines to relieve pain.  Prescription medicines, such as anti-inflammatory medicine, muscle relaxants, or narcotics.  Applying heat or ice to the painful area.  Steroid injections to lessen pain, irritation, and inflammation around the nerve.  Reducing activity during periods of pain.  Exercising and stretching to strengthen your abdomen and improve flexibility of your spine. Your caregiver may suggest losing weight if the extra weight makes the back pain worse.  Physical therapy.  Surgery to eliminate what is pressing or pinching the nerve, such as a bone spur or part of a herniated disk. HOME CARE INSTRUCTIONS   Only take over-the-counter or prescription medicines for pain or discomfort as directed by your caregiver.  Apply ice to the affected area for 20 minutes, 3-4 times a day for the first 48-72 hours. Then try heat in the same way.  Exercise, stretch, or perform your usual activities if these do not aggravate your pain.  Attend physical therapy sessions as directed by your caregiver.  Keep all follow-up appointments as directed by your caregiver.  Do not wear high heels or shoes that do not provide proper support.  Check your mattress to see if it is too soft. A firm mattress may lessen your pain and discomfort. SEEK IMMEDIATE MEDICAL CARE IF:   You lose control of your bowel or bladder (  incontinence).  You have increasing weakness in the lower back, pelvis, buttocks, or legs.  You have redness or swelling of your back.  You have a burning sensation when you urinate.  You have pain that gets worse when you lie down or awakens you at night.  Your pain is worse than you have experienced in the past.  Your pain is lasting longer than 4 weeks.  You are suddenly losing weight without  reason. MAKE SURE YOU:  Understand these instructions.  Will watch your condition.  Will get help right away if you are not doing well or get worse.   This information is not intended to replace advice given to you by your health care provider. Make sure you discuss any questions you have with your health care provider.   Document Released: 06/22/2001 Document Revised: 03/19/2015 Document Reviewed: 11/07/2011 Elsevier Interactive Patient Education 2016 [employer]                 :45

## 2017-04-18 ENCOUNTER — Encounter: Payer: Self-pay | Admitting: Family Medicine

## 2017-04-18 NOTE — Progress Notes (Deleted)
   Subjective:   Patient ID: Claudia Washington    DOB: 1981/02/10, 36 y.o. female   MRN: [id number]  Claudia Washington is a 36 y.o. female with no significant PMH here for ***  Healthcare Maintenance - Vaccines: flu, tetanus - Pap Smear: ***  Review of Systems:  Per HPI.   PMFSH: ***. Smoking status reviewed. Medications reviewed.  Objective:   There were no vitals taken for this visit. Vitals and nursing note reviewed.  General: well nourished, well developed, in no acute distress with non-toxic appearance HEENT: normocephalic, atraumatic, moist mucous membranes Neck: supple, non-tender without lymphadenopathy CV: regular rate and rhythm without murmurs, rubs, or gallops, no lower extremity edema Lungs: clear to auscultation bilaterally with normal work of breathing Abdomen: soft, non-tender, non-distended, no masses or organomegaly palpable, normoactive bowel sounds Skin: warm, dry, no rashes or lesions, cap refill < 2 seconds Extremities: warm and well perfused, normal tone MSK: Full ROM, strength intact, gait normal Neuro: Alert and oriented, speech normal  Assessment & Plan:   No problem-specific Assessment & Plan notes found for this encounter.  No orders of the defined types were placed in this encounter.  No orders of the defined types were placed in this encounter.   Claudia Dense, DO PGY-1, [facility] 04/18/2017 4:49 PM

## 2017-04-19 ENCOUNTER — Ambulatory Visit: Payer: Medicaid Other | Admitting: Family Medicine

## 2017-05-09 ENCOUNTER — Encounter (HOSPITAL_COMMUNITY): Payer: Self-pay | Admitting: Emergency Medicine

## 2017-05-09 ENCOUNTER — Ambulatory Visit (HOSPITAL_COMMUNITY)
Admission: EM | Admit: 2017-05-09 | Discharge: 2017-05-09 | Disposition: A | Discharge status: Home / Self Care | Payer: Medicaid Other | Attending: Emergency Medicine | Admitting: Emergency Medicine

## 2017-05-09 DIAGNOSIS — G8929 Other chronic pain: Secondary | ICD-10-CM | POA: Diagnosis not present

## 2017-05-09 DIAGNOSIS — M25511 Pain in right shoulder: Secondary | ICD-10-CM

## 2017-05-09 DIAGNOSIS — M7581 Other shoulder lesions, right shoulder: Secondary | ICD-10-CM | POA: Diagnosis not present

## 2017-05-09 MED ORDER — METHOCARBAMOL 750 MG PO TABS: 750.0000 mg | ORAL_TABLET | ORAL | 0 refills | Status: DC

## 2017-05-09 MED ORDER — DICLOFENAC SODIUM 75 MG PO TBEC: 75.0000 mg | DELAYED_RELEASE_TABLET | Freq: Two times a day (BID) | ORAL | 0 refills | Status: DC

## 2017-05-09 NOTE — Discharge Instructions (Signed)
Take the diclofenac with 1 g of Tylenol twice a day.  You may take an additional gram of Tylenol to other times a day.  Follow-up with Dr. Rennis ChrisSupple or with Dr. Aundria Rudogers.  They are in the same practice.

## 2017-05-09 NOTE — ED Triage Notes (Signed)
Pt c/o R shoulder pain, started last year, gotten worse today.

## 2017-05-09 NOTE — ED Provider Notes (Signed)
HPI  SUBJECTIVE:  Claudia Washington is a 36 y.o. female who presents with right shoulder pain for the past year.  She reports dull, throbbing, intermittent pain, present only with activity.  She reports swelling along her right neck and soreness starting yesterday.  She is right-handed.  She reports crepitus, states that her shoulder is waking her up at night.  She tried ice, and an unknown muscle relaxant, symptoms are better with rest.  Symptoms are worse with any movement particularly with abduction and forward extension.  No trauma to her shoulder, no history of injury to the shoulder.  She denies any repetitive overhead movements.  No numbness, tingling, grip weakness.  No sensation of instability.  No history of rotator cuff injury, diabetes, hypertension, cancer, osteoarthritis, rheumatoid arthritis.  LMP: The second week of October.  She is status post bilateral tubal ligation.  [facility]: [facility].  She has a new patient appointment with them on Friday.    Past Medical History:  Diagnosis Date  . Anemia   . Irregular menses   . Superficial thrombophlebitis     Past Surgical History:  Procedure Laterality Date  . GANGLION CYST EXCISION    . TUBAL LIGATION  2008    Family History  Problem Relation Age of Onset  . Hypertension Mother   . Diabetes Maternal Grandmother   . Stroke Maternal Grandmother   . Hypertension Maternal Grandmother   . Depression Paternal Grandmother   . Asthma Sister   . Asthma Brother     Social History  Substance Use Topics  . Smoking status: Never Smoker  . Smokeless tobacco: Not on file  . Alcohol use No    No current facility-administered medications for this encounter.   Current Outpatient Prescriptions:  .  diclofenac (VOLTAREN) 75 MG EC tablet, Take 1 tablet (75 mg total) by mouth 2 (two) times daily. Take with food, Disp: 30 tablet, Rfl: 0 .  methocarbamol (ROBAXIN) 750 MG tablet, Take 1 tablet (750 mg total) by mouth every 4  (four) hours., Disp: 40 tablet, Rfl: 0  No Known Allergies   ROS  As noted in HPI.   Physical Exam  BP 122/79   Pulse 95   Temp 98.3 F (36.8 C) (Oral)   Resp 16   LMP 04/12/2017   SpO2 100%   Constitutional: Well developed, well nourished, no acute distress Eyes:  EOMI, conjunctiva normal bilaterally HENT: Normocephalic, atraumatic,mucus membranes moist Respiratory: Normal inspiratory effort Cardiovascular: Normal rate GI: nondistended skin: No rash, skin intact Musculoskeletal: Right shoulder with ROM normal, Drop test painful but negative, Tenderness over the trapezius, rhomboid, positive muscle spasm, clavicle NT, A/C joint NT, scapula NT , proximal humerus NT, shoulder joint  tender, Motor strength normal, Sensation intact LT over deltoid region, distal NVI with hand on affected side having intact sensation and strength in the distribution of the median, radial, and ulnar nerve. no pain with internal rotation, pain with external rotation, negative tenderness in bicipital groove, positive  empty can test, positive liftoff test,  negative "popeye" sign, no instability with abduction/external rotation.  Neurologic: Alert & oriented x 3, no focal neuro deficits Psychiatric: Speech and behavior appropriate   ED Course   Medications - No data to display  No orders of the defined types were placed in this encounter.   No results found for this or any previous visit (from the past 24 hour(s)). No results found.  ED Clinical Impression  Tendinitis of right rotator  cuff  Chronic right shoulder pain   ED Assessment/Plan  [place] Narcotic database reviewed for this patient, and feel that the risk/benefit ratio today is favorable for proceeding with a prescription for controlled substance.  No opiate prescriptions in the past 2 years.  Patient consistent with a chronic rotator cuff tendinitis.  She does have some trapezius muscle spasm and tenderness, so we will send home  with Robaxin, addition to diclofenac/Tylenol combination.  Referring to Dr. Duwayne HeckJason Rogers or Dr. Francena HanlyKevin Supple, orthopedic surgeons for comprehensive evaluation.  Advised patient to sleep with a pillow between her arm and her body.  Patient declined x-ray today.  Discussed labs, imaging, MDM, plan and followup with patient. Discussed sn/sx that should prompt return to the ED. patient agrees with plan.   Meds ordered this encounter  Medications  . diclofenac (VOLTAREN) 75 MG EC tablet    Sig: Take 1 tablet (75 mg total) by mouth 2 (two) times daily. Take with food    Dispense:  30 tablet    Refill:  0  . methocarbamol (ROBAXIN) 750 MG tablet    Sig: Take 1 tablet (750 mg total) by mouth every 4 (four) hours.    Dispense:  40 tablet    Refill:  0    *This clinic note was created using Scientist, clinical (histocompatibility and immunogenetics)Dragon dictation software. Therefore, there may be occasional mistakes despite careful proofreading.   ?    Domenick GongMortenson, Diane Hanel, MD 05/10/17 [phone]

## 2017-05-12 NOTE — Progress Notes (Signed)
Subjective:   Patient ID: Claudia Washington    DOB: March 27, 1981, 36 y.o. female   MRN: [id number]  Claudia Nipplearis L Toruno is a 36 y.o. female with no significant PMH here for   Establish care - PMH, FH, Surgical and social histories, and medications reviewed. - due for flu and tetanus vaccines - declined - due for pap smear, on menstrual cycle currently.  R Shoulder pain - Symptoms started last year November intermittently. Recently flared up last week when she woke up with shoulder pain. States prior to this she opened many cans of tuna and stirred a lot while making tuna salad. Thinks she may have heard a pop, but unsure. - Seen at Urgent Care 10/29, thought to be rotator cuff tendinitis and was told to call orthopedic surgery for an appointment. Patient does not have appointment yet.  - Denies trauma, fevers - Has been massaging it every day with little relief - Also taking muscle relaxer she received 2017 for leg pain, ibuprofen 400mg , and ice packs with good relief  Review of Systems:  Per HPI.   PMFSH: reviewed. Smoking status reviewed. Medications reviewed.  Objective:   BP 112/64   Pulse 77   Temp 97.9 F (36.6 C) (Oral)   Ht 5\' 4"  (1.626 m)   Wt 147 lb (66.7 kg)   LMP 05/12/2017   SpO2 99%   BMI 25.23 kg/m  Vitals and nursing note reviewed.  General: well nourished, well developed, in no acute distress with non-toxic appearance CV: regular rate and rhythm without murmurs, rubs, or gallops Lungs: clear to auscultation bilaterally with normal work of breathing Skin: warm, dry, no rashes or lesions Extremities: warm and well perfused, normal tone.  MSK: R shoulder tender to light palpation. Limited painful active ROM to 40 degrees flexion, unable to extend, abduction to about 60 degrees. Unable to flex passively due to pain. Strength 5/5 for UE, gait normal. Neuro: Alert and oriented, speech normal.  Assessment & Plan:   Acute pain of right shoulder Pain previously noted last  year per patient with recent worsening. Evaluated at Urgent Care 10/29 and thought to be due to rotator cuff tendinitis. Pain well controlled with immobilization, ibuprofen, muscle relaxer, and ice packs. Exam concerning for possible rotator cuff tear as patient unable to actively or passively get good ROM, also with significant pain on mobilization. - Shoulder MRI - referral to orthopedic surgery - continue ibuprofen, muscle relaxer, ice packs for inflammation and pain relief.  Orders Placed This Encounter  Procedures  . MR Shoulder Right Wo Contrast    Standing Status:   Future    Standing Expiration Date:   07/13/2018    Order Specific Question:   What is the patient's sedation requirement?    Answer:   No Sedation    Order Specific Question:   Does the patient have a pacemaker or implanted devices?    Answer:   No    Order Specific Question:   Preferred imaging location?    Answer:   [facility] (table limit-500 lbs)    Order Specific Question:   Radiology Contrast Protocol - do NOT remove file path    Answer:   [email or web address]  . Ambulatory referral to Orthopedic Surgery    Referral Priority:   Routine    Referral Type:   Surgical    Referral Reason:   Specialty Services Required    Requested Specialty:   Orthopedic Surgery    Number of Visits Requested:  1   No orders of the defined types were placed in this encounter.   Ellwood Dense, DO PGY-1, [facility] 05/13/2017 2:25 PM

## 2017-05-13 ENCOUNTER — Ambulatory Visit (INDEPENDENT_AMBULATORY_CARE_PROVIDER_SITE_OTHER): Payer: Medicaid Other | Admitting: Family Medicine

## 2017-05-13 DIAGNOSIS — M25511 Pain in right shoulder: Secondary | ICD-10-CM | POA: Insufficient documentation

## 2017-05-13 NOTE — Assessment & Plan Note (Signed)
Pain previously noted last year per patient with recent worsening. Evaluated at Urgent Care 10/29 and thought to be due to rotator cuff tendinitis. Pain well controlled with immobilization, ibuprofen, muscle relaxer, and ice packs. Exam concerning for possible rotator cuff tear as patient unable to actively or passively get good ROM, also with significant pain on mobilization. - Shoulder MRI - referral to orthopedic surgery - continue ibuprofen, muscle relaxer, ice packs for inflammation and pain relief.

## 2017-05-13 NOTE — Patient Instructions (Addendum)
It was great to see you!  For your shoulder pain,  - It is likely you have tendinitis of your shoulder, but we want to rule out tear of your tendons. We are getting a shoulder MRI. - We are placing an orthopedic surgery referral for further evaluation.  You should make an appointment when you check out to have a well-woman visit to update your pap smear.  Take care and seek immediate care sooner if you develop any concerns.  Ellwood Dense, DO [facility]   Rotator Cuff Injury Rotator cuff injury is any type of injury to the set of muscles and tendons that make up the stabilizing unit of your shoulder. This unit holds the ball of your upper arm bone (humerus) in the socket of your shoulder blade (scapula). What are the causes? Injuries to your rotator cuff most commonly come from sports or activities that cause your arm to be moved repeatedly over your head. Examples of this include throwing, weight lifting, swimming, or racquet sports. Long lasting (chronic) irritation of your rotator cuff can cause soreness and swelling (inflammation), bursitis, and eventual damage to your tendons, such as a tear (rupture). What are the signs or symptoms? Acute rotator cuff tear:  Sudden tearing sensation followed by severe pain shooting from your upper shoulder down your arm toward your elbow.  Decreased range of motion of your shoulder because of pain and muscle spasm.  Severe pain.  Inability to raise your arm out to the side because of pain and loss of muscle power (large tears).  Chronic rotator cuff tear:  Pain that usually is worse at night and may interfere with sleep.  Gradual weakness and decreased shoulder motion as the pain worsens.  Decreased range of motion.  Rotator cuff tendinitis:  Deep ache in your shoulder and the outside upper arm over your shoulder.  Pain that comes on gradually and becomes worse when lifting your arm to the side or turning it inward.  How is  this diagnosed? Rotator cuff injury is diagnosed through a medical history, physical exam, and imaging exam. The medical history helps determine the type of rotator cuff injury. Your health care provider will look at your injured shoulder, feel the injured area, and ask you to move your shoulder in different positions. X-ray exams typically are done to rule out other causes of shoulder pain, such as fractures. MRI is the exam of choice for the most severe shoulder injuries because the images show muscles and tendons. How is this treated? Chronic tear:  Medicine for pain, such as acetaminophen or ibuprofen.  Physical therapy and range-of-motion exercises may be helpful in maintaining shoulder function and strength.  Steroid injections into your shoulder joint.  Surgical repair of the rotator cuff if the injury does not heal with noninvasive treatment.  Acute tear:  Anti-inflammatory medicines such as ibuprofen and naproxen to help reduce pain and swelling.  A sling to help support your arm and rest your rotator cuff muscles. Long-term use of a sling is not advised. It may cause significant stiffening of the shoulder joint.  Surgery may be considered within a few weeks, especially in younger, active people, to return the shoulder to full function.  Indications for surgical treatment include the following: ? Age younger than 60 years. ? Rotator cuff tears that are complete. ? Physical therapy, rest, and anti-inflammatory medicines have been used for 6-8 weeks, with no improvement. ? Employment or sporting activity that requires constant shoulder use.  Tendinitis:  Anti-inflammatory medicines such as ibuprofen and naproxen to help reduce pain and swelling.  A sling to help support your arm and rest your rotator cuff muscles. Long-term use of a sling is not advised. It may cause significant stiffening of the shoulder joint.  Severe tendinitis may require: ? Steroid injections into your  shoulder joint. ? Physical therapy. ? Surgery.  Follow these instructions at home:  Apply ice to your injury: ? Put ice in a plastic bag. ? Place a towel between your skin and the bag. ? Leave the ice on for 20 minutes, 2-3 times a day.  If you have a shoulder immobilizer (sling and straps), wear it until told otherwise by your health care provider.  You may want to sleep on several pillows or in a recliner at night to lessen swelling and pain.  Only take over-the-counter or prescription medicines for pain, discomfort, or fever as directed by your health care provider.  Do simple hand squeezing exercises with a soft rubber ball to decrease hand swelling. Contact a health care provider if:  Your shoulder pain increases, or new pain or numbness develops in your arm, hand, or fingers.  Your hand or fingers are colder than your other hand. Get help right away if:  Your arm, hand, or fingers are numb or tingling.  Your arm, hand, or fingers are increasingly swollen and painful, or they turn white or blue. This information is not intended to replace advice given to you by your health care provider. Make sure you discuss any questions you have with your health care provider. Document Released: 06/25/2000 Document Revised: 12/04/2015 Document Reviewed: 02/07/2013 Elsevier Interactive Patient Education  2018 [employer]

## 2017-05-19 ENCOUNTER — Ambulatory Visit (HOSPITAL_COMMUNITY)
Admission: RE | Admit: 2017-05-19 | Discharge: 2017-05-19 | Disposition: A | Discharge status: Home / Self Care | Payer: Medicaid Other | Source: Ambulatory Visit | Attending: Family Medicine | Admitting: Family Medicine

## 2017-05-19 DIAGNOSIS — M25511 Pain in right shoulder: Secondary | ICD-10-CM | POA: Diagnosis present

## 2017-05-19 DIAGNOSIS — M25711 Osteophyte, right shoulder: Secondary | ICD-10-CM | POA: Insufficient documentation

## 2017-05-19 DIAGNOSIS — M7591 Shoulder lesion, unspecified, right shoulder: Secondary | ICD-10-CM | POA: Insufficient documentation

## 2017-05-26 ENCOUNTER — Telehealth: Payer: Self-pay | Admitting: Family Medicine

## 2017-05-26 NOTE — Telephone Encounter (Signed)
Spoke with patient and informed her that referral was placed in workqueue for [facility] and [place] from their office tried to contact her on 05/16/17.  Patient was given their office number to call and schedule her appointment.  Since [facility] is also on Epic they can see her MRI results in the computer.  Patient voiced understanding. Jazmin Hartsell,CMA

## 2017-05-26 NOTE — Telephone Encounter (Signed)
Pt called and said in order for her to be seen at Orthopedic Surgery for her shoulder she would need referral by her PCP. They are also requesting that her MRI be put on a disk and given to them as well. The [facility] address is [address], [place]. Please advise

## 2017-05-30 ENCOUNTER — Ambulatory Visit (INDEPENDENT_AMBULATORY_CARE_PROVIDER_SITE_OTHER): Payer: Self-pay | Admitting: Orthopedic Surgery

## 2017-06-08 ENCOUNTER — Telehealth (INDEPENDENT_AMBULATORY_CARE_PROVIDER_SITE_OTHER): Payer: Self-pay | Admitting: Orthopedic Surgery

## 2017-06-08 ENCOUNTER — Ambulatory Visit (INDEPENDENT_AMBULATORY_CARE_PROVIDER_SITE_OTHER): Payer: Self-pay | Admitting: Orthopedic Surgery

## 2017-06-08 NOTE — Telephone Encounter (Signed)
Returned call to patient left message to call back to R/S appointment [phone]

## 2017-06-17 ENCOUNTER — Ambulatory Visit (INDEPENDENT_AMBULATORY_CARE_PROVIDER_SITE_OTHER): Payer: Medicaid Other | Admitting: Orthopedic Surgery

## 2017-06-23 ENCOUNTER — Ambulatory Visit (INDEPENDENT_AMBULATORY_CARE_PROVIDER_SITE_OTHER): Payer: Medicaid Other | Admitting: Orthopedic Surgery

## 2017-06-29 ENCOUNTER — Ambulatory Visit (INDEPENDENT_AMBULATORY_CARE_PROVIDER_SITE_OTHER): Payer: Medicaid Other | Admitting: Orthopedic Surgery

## 2017-06-29 ENCOUNTER — Encounter (INDEPENDENT_AMBULATORY_CARE_PROVIDER_SITE_OTHER): Payer: Self-pay | Admitting: Orthopedic Surgery

## 2017-06-29 DIAGNOSIS — M25511 Pain in right shoulder: Secondary | ICD-10-CM

## 2017-06-29 MED ORDER — MELOXICAM 15 MG PO TABS: ORAL_TABLET | ORAL | 0 refills | Status: DC

## 2017-07-02 ENCOUNTER — Encounter (INDEPENDENT_AMBULATORY_CARE_PROVIDER_SITE_OTHER): Payer: Self-pay | Admitting: Orthopedic Surgery

## 2017-07-02 DIAGNOSIS — M25511 Pain in right shoulder: Secondary | ICD-10-CM

## 2017-07-02 MED ORDER — LIDOCAINE HCL 1 % IJ SOLN
5.0000 mL | INTRAMUSCULAR | Status: AC | PRN
  Administered 2017-07-02: 5 mL

## 2017-07-02 MED ORDER — METHYLPREDNISOLONE ACETATE 40 MG/ML IJ SUSP
40.0000 mg | INTRAMUSCULAR | Status: AC | PRN
  Administered 2017-07-02: 40 mg via INTRA_ARTICULAR

## 2017-07-02 MED ORDER — BUPIVACAINE HCL 0.5 % IJ SOLN
9.0000 mL | INTRAMUSCULAR | Status: AC | PRN
  Administered 2017-07-02: 9 mL via INTRA_ARTICULAR

## 2017-07-02 NOTE — Progress Notes (Signed)
Office Visit Note   Patient: Claudia Washington           Date of Birth: 1981-03-11           MRN: [id number] Visit Date: 06/29/2017 Requested by: Ellwood Denseumball, Alison, DO [address] [place], [place] [id number] PCP: Ellwood Denseumball, Alison, DO  Subjective: Chief Complaint  Patient presents with  . Right Shoulder - Pain  . Left Shoulder - Pain    HPI: Patient presents with bilateral shoulder pain.  Started in the left shoulder 2 months ago.  The right shoulder pain started a year ago.  Right shoulder MRI scan shows subacromial spurring and rotator cuff tendinopathy.  Patient states his shoulder hurts when he is doing certain movements.  Cannot lay on his shoulders.  He is unemployed.              ROS: All systems reviewed are negative as they relate to the chief complaint within the history of present illness.  Patient denies  fevers or chills.   Assessment & Plan: Visit Diagnoses:  1. Right shoulder pain, unspecified chronicity     Plan: Impression is right shoulder pain with rotator cuff tendinopathy and subacromial spurring on the right-hand side.  Plan is physical therapy plus subacromial injection plus anti-inflammatory.  Return in 6 weeks if improvement and we can consider repeat injection at that time.  Follow-Up Instructions: Return if symptoms worsen or fail to improve.   Orders:  Orders Placed This Encounter  Procedures  . Ambulatory referral to Physical Therapy   Meds ordered this encounter  Medications  . meloxicam (MOBIC) 15 MG tablet    Sig: 1 po q d x 3 weeks    Dispense:  60 tablet    Refill:  0      Procedures: Large Joint Inj: R subacromial bursa on 07/02/2017 9:35 AM Indications: diagnostic evaluation and pain Details: 18 G 1.5 in needle, posterior approach  Arthrogram: No  Medications: 9 mL bupivacaine 0.5 %; 40 mg methylPREDNISolone acetate 40 MG/ML; 5 mL lidocaine 1 % Outcome: tolerated well, no immediate complications Procedure, treatment alternatives,  risks and benefits explained, specific risks discussed. Consent was given by the patient. Immediately prior to procedure a time out was called to verify the correct patient, procedure, equipment, support staff and site/side marked as required. Patient was prepped and draped in the usual sterile fashion.       Clinical Data: No additional findings.  Objective: Vital Signs: There were no vitals taken for this visit.  Physical Exam:   Constitutional: Patient appears well-developed HEENT:  Head: Normocephalic Eyes:EOM are normal Neck: Normal range of motion Cardiovascular: Normal rate Pulmonary/chest: Effort normal Neurologic: Patient is alert Skin: Skin is warm Psychiatric: Patient has normal mood and affect    Ortho Exam: Pubic exam demonstrates good cervical spine range of motion with good motor sensory function in bilateral upper extremities.  Motion is full.  Radial pulses intact.  Both shoulders are examined.  Rotator cuff strength in both shoulders to infraspinatus supraspinatus and subscapularis muscle testing.  No other masses lymphadenopathy or skin changes noted in the shoulder girdle regions.  Negative apprehension relocation testing bilaterally.  No AC joint tenderness bilaterally.  Impingement signs positive bilaterally.  Specialty Comments:  No specialty comments available.  Imaging: No results found.   PMFS History: Patient Active Problem List   Diagnosis Date Noted  . Acute pain of right shoulder 05/13/2017   Past Medical History:  Diagnosis Date  .  Anemia   . Irregular menses   . Superficial thrombophlebitis     Family History  Problem Relation Age of Onset  . Hypertension Mother   . Diabetes Maternal Grandmother   . Stroke Maternal Grandmother   . Hypertension Maternal Grandmother   . Depression Paternal Grandmother   . Asthma Sister   . Asthma Brother     Past Surgical History:  Procedure Laterality Date  . GANGLION CYST EXCISION    . TUBAL  LIGATION  2008   Social History   Occupational History  . Not on file  Tobacco Use  . Smoking status: Never Smoker  . Smokeless tobacco: Never Used  Substance and Sexual Activity  . Alcohol use: No  . Drug use: No  . Sexual activity: Yes    Birth control/protection: Surgical

## 2017-07-21 ENCOUNTER — Ambulatory Visit: Payer: Medicaid Other

## 2017-07-26 ENCOUNTER — Ambulatory Visit: Payer: Medicaid Other

## 2017-08-03 ENCOUNTER — Ambulatory Visit: Payer: Medicaid Other | Admitting: Physical Therapy

## 2017-08-05 ENCOUNTER — Ambulatory Visit: Payer: Medicaid Other | Attending: Orthopedic Surgery | Admitting: Physical Therapy

## 2017-08-05 ENCOUNTER — Ambulatory Visit: Payer: Medicaid Other | Admitting: Physical Therapy

## 2017-08-09 ENCOUNTER — Emergency Department (HOSPITAL_COMMUNITY)
Admission: EM | Admit: 2017-08-09 | Discharge: 2017-08-09 | Discharge status: Against Medical Advice | Payer: Medicaid Other

## 2017-08-09 ENCOUNTER — Encounter (HOSPITAL_COMMUNITY): Payer: Self-pay | Admitting: Emergency Medicine

## 2017-08-09 ENCOUNTER — Ambulatory Visit (HOSPITAL_COMMUNITY)
Admission: EM | Admit: 2017-08-09 | Discharge: 2017-08-09 | Disposition: A | Discharge status: Home / Self Care | Payer: Medicaid Other | Attending: Family Medicine | Admitting: Family Medicine

## 2017-08-09 DIAGNOSIS — H6501 Acute serous otitis media, right ear: Secondary | ICD-10-CM

## 2017-08-09 DIAGNOSIS — R0981 Nasal congestion: Secondary | ICD-10-CM | POA: Diagnosis not present

## 2017-08-09 MED ORDER — OXYMETAZOLINE HCL 0.05 % NA SOLN: 1.0000 | Freq: Two times a day (BID) | NASAL | 0 refills | Status: DC

## 2017-08-09 MED ORDER — PSEUDOEPHEDRINE HCL 60 MG PO TABS: 60.0000 mg | ORAL_TABLET | ORAL | 0 refills | Status: DC | PRN

## 2017-08-09 NOTE — ED Provider Notes (Signed)
[facility]    CSN: [id number] Arrival date & time: 08/09/17  1753     History   Chief Complaint Chief Complaint  Patient presents with  . Otalgia    HPI Claudia Washington is a 37 y.o. female presents to the urgent care facility for evaluation of bilateral ear pressure, sinus congestion.  Symptoms been present for 3 days.  Patient denies any fevers, sore throat, cough.  She denies any loss of hearing.  She is not taking medications for symptoms.  She complains of frontal and maxillary sinus congestion with sensation of pain and pressure in the right greater than left ear.  HPI  Past Medical History:  Diagnosis Date  . Anemia   . Irregular menses   . Superficial thrombophlebitis     Patient Active Problem List   Diagnosis Date Noted  . Acute pain of right shoulder 05/13/2017    Past Surgical History:  Procedure Laterality Date  . GANGLION CYST EXCISION    . TUBAL LIGATION  2008    OB History    Gravida Para Term Preterm AB Living   5 5 4 1   5    SAB TAB Ectopic Multiple Live Births                   Home Medications    Prior to Admission medications   Medication Sig Start Date End Date Taking? Authorizing Provider  meloxicam (MOBIC) 15 MG tablet 1 po q d x 3 weeks 06/29/17   Cammy Copa, MD  oxymetazoline [facility] NASAL SPRAY) 0.05 % nasal spray Place 1 spray into both nostrils 2 (two) times daily. 08/09/17   Evon Slack, PA-C  pseudoephedrine (SUDAFED) 60 MG tablet Take 1 tablet (60 mg total) by mouth every 4 (four) hours as needed for congestion. 08/09/17   Evon Slack, PA-C    Family History Family History  Problem Relation Age of Onset  . Hypertension Mother   . Diabetes Maternal Grandmother   . Stroke Maternal Grandmother   . Hypertension Maternal Grandmother   . Depression Paternal Grandmother   . Asthma Sister   . Asthma Brother     Social History Social History   Tobacco Use  . Smoking status: Never Smoker  .  Smokeless tobacco: Never Used  Substance Use Topics  . Alcohol use: No  . Drug use: No     Allergies   Patient has no known allergies.   Review of Systems Review of Systems  Constitutional: Negative for fever.  HENT: Positive for congestion, ear pain, rhinorrhea and sore throat. Negative for ear discharge, facial swelling, hearing loss, sinus pressure, sinus pain, trouble swallowing and voice change.   Respiratory: Negative for cough, shortness of breath, wheezing and stridor.   Cardiovascular: Negative for chest pain.  Gastrointestinal: Negative for abdominal pain, diarrhea, nausea and vomiting.  Genitourinary: Negative for dysuria, flank pain and pelvic pain.  Musculoskeletal: Positive for myalgias. Negative for back pain.  Skin: Negative for rash.  Neurological: Negative for dizziness and headaches.     Physical Exam Triage Vital Signs ED Triage Vitals  Enc Vitals Group     BP 08/09/17 1828 133/72     Pulse Rate 08/09/17 1828 88     Resp 08/09/17 1828 16     Temp 08/09/17 1828 98.3 F (36.8 C)     Temp Source 08/09/17 1828 Oral     SpO2 08/09/17 1828 99 %     Weight 08/09/17  1827 145 lb (65.8 kg)     Height --      Head Circumference --      Peak Flow --      Pain Score 08/09/17 1827 0     Pain Loc --      Pain Edu? --      Excl. in GC? --    No data found.  Updated Vital Signs BP 133/72   Pulse 88   Temp 98.3 F (36.8 C) (Oral)   Resp 16   Wt 145 lb (65.8 kg)   LMP 07/26/2017   SpO2 99%   BMI 24.89 kg/m   Visual Acuity Right Eye Distance:   Left Eye Distance:   Bilateral Distance:    Right Eye Near:   Left Eye Near:    Bilateral Near:     Physical Exam  Constitutional: She is oriented to person, place, and time. She appears well-developed and well-nourished. No distress.  HENT:  Head: Normocephalic and atraumatic.  Right Ear: Hearing, tympanic membrane, external ear and ear canal normal.  Left Ear: Hearing, tympanic membrane, external ear  and ear canal normal.  Nose: Rhinorrhea present.  Mouth/Throat: Mucous membranes are normal. No trismus in the jaw. No uvula swelling. Posterior oropharyngeal erythema present. No oropharyngeal exudate, posterior oropharyngeal edema or tonsillar abscesses. No tonsillar exudate.  Bilateral TMs nonerythematous with slight bulging with serous fluid behind each TM.  No sign of any visible foreign body.  Foreign body.  No signs of bacterial infection.  Both TMs are intact.  Mild positive left and right maxillary sinus tenderness.  Eyes: Conjunctivae are normal. Right eye exhibits no discharge. Left eye exhibits no discharge.  Neck: Normal range of motion.  Cardiovascular: Normal rate and regular rhythm.  Pulmonary/Chest: Effort normal and breath sounds normal. No stridor. No respiratory distress. She has no wheezes. She has no rales.  Musculoskeletal: Normal range of motion. She exhibits no deformity.  Lymphadenopathy:    She has cervical adenopathy.  Neurological: She is alert and oriented to person, place, and time. She has normal reflexes.  Skin: Skin is warm and dry.  Psychiatric: She has a normal mood and affect. Her behavior is normal. Thought content normal.     UC Treatments / Results  Labs (all labs ordered are listed, but only abnormal results are displayed) Labs Reviewed - No data to display  EKG  EKG Interpretation None       Radiology No results found.  Procedures Procedures (including critical care time)  Medications Ordered in UC Medications - No data to display   Initial Impression / Assessment and Plan / UC Course  I have reviewed the triage vital signs and the nursing notes.  Pertinent labs & imaging results that were available during my care of the patient were reviewed by me and considered in my medical decision making (see chart for details).     37 year old female with sinus pain and pressure along with right greater than left ear pain and pressure.   Exam is unremarkable for any sign of otitis externa or supportive otitis media.  Patient appears to have noninfected fluid behind both ears.  She is placed on decongestant medication, encouraged to increase fluids.  She is given a prescription for Afrin for nasal congestion. Final Clinical Impressions(s) / UC Diagnoses   Final diagnoses:  Sinus congestion  Right acute serous otitis media, recurrence not specified    ED Discharge Orders        Ordered  oxymetazoline [facility] NASAL SPRAY) 0.05 % nasal spray  2 times daily     08/09/17 1908    pseudoephedrine (SUDAFED) 60 MG tablet  Every 4 hours PRN     08/09/17 1908         Evon Slack, [place] 08/09/17 1913

## 2017-08-09 NOTE — ED Notes (Signed)
Patient to nurse first saying she "can't wait". "i'm going to urgent care".

## 2017-08-09 NOTE — Discharge Instructions (Signed)
Please drink lots of fluids.  Take pseudoephedrine as prescribed.  Use nasal spray as prescribed.  Return to the clinic for any increasing pain, fevers, worsening symptoms or urgent changes in health.

## 2017-08-09 NOTE — ED Triage Notes (Signed)
PT reports right ear pain since Friday.

## 2017-08-09 NOTE — ED Triage Notes (Signed)
Pt to ED via [facility] from home.  Pt st's 4 days ago she felt something fly into her left ear.

## 2017-09-19 ENCOUNTER — Encounter: Payer: Self-pay | Admitting: Internal Medicine

## 2017-09-19 ENCOUNTER — Ambulatory Visit (INDEPENDENT_AMBULATORY_CARE_PROVIDER_SITE_OTHER): Payer: Medicaid Other | Admitting: Internal Medicine

## 2017-09-19 VITALS — BP 120/72 | HR 80 | Temp 98.3°F | Ht 64.0 in | Wt 138.4 lb

## 2017-09-19 DIAGNOSIS — K219 Gastro-esophageal reflux disease without esophagitis: Secondary | ICD-10-CM | POA: Insufficient documentation

## 2017-09-19 DIAGNOSIS — K5900 Constipation, unspecified: Secondary | ICD-10-CM | POA: Diagnosis present

## 2017-09-19 MED ORDER — GI COCKTAIL ~~LOC~~
30.0000 mL | Freq: Once | ORAL | Status: AC
  Administered 2017-09-19: 30 mL via ORAL

## 2017-09-19 MED ORDER — POLYETHYLENE GLYCOL 3350 17 G PO PACK: 17.0000 g | PACK | Freq: Every day | ORAL | 0 refills | Status: DC

## 2017-09-19 MED ORDER — FAMOTIDINE 20 MG PO TABS: 20.0000 mg | ORAL_TABLET | Freq: Two times a day (BID) | ORAL | 0 refills | Status: DC

## 2017-09-19 NOTE — Patient Instructions (Signed)
I am going to prescribe you medication for your constipation called MiraLAX.  Please take 17 g of MiraLAX for 1 packet at a time.  You can increase to 2 packets if you do not have a significant bowel movement once in the morning once at night you can even increase to 3 packets until you have a good bowel movement.  I have also prescribed you some reflux medication called Pepcid this will help with your reflux type symptoms. Please follow-up in 2-3 days if no improvement

## 2017-09-19 NOTE — Assessment & Plan Note (Signed)
-  To take 1 packet of MiraLAX tonight and then 2 packets of MiraLAX tomorrow and then 3 packets of MiraLAX the following day until she has a bowel movement - polyethylene glycol (MIRALAX / GLYCOLAX) packet; Take 17 g by mouth daily.  Dispense: 24 each; Refill: 0 -Discussed food recommendations and also possibly adding prune juice to this regiment

## 2017-09-19 NOTE — Assessment & Plan Note (Signed)
Reflux-like symptoms  -We will provide patient with a GI cocktail during visit -Pepcid twice daily for the next 2 weeks -Follow-up in 2-3 days if no improvement in patient's symptoms

## 2017-09-19 NOTE — Addendum Note (Signed)
Addended by: Pamelia HoitBLOUNT, Jaeshaun Riva C on: 09/19/2017 02:28 PM   Modules accepted: Orders

## 2017-09-19 NOTE — Progress Notes (Signed)
   [facility] Claudia CharsAsiyah Washington Cieslinski, MD Phone: [phone]  Reason For Visit: SDA for URI   # ABDOMINAL PAIN  Patient with intermittent abdominal pain for 2 weeks.  She states she has not had a bowel movement in about that time.  She states that she has had very small hard bowel movements but not really significant bowel movement.  She also notes having some burning pain going from her epigastric area to her esophagus over the past 2 days.  She indicates having some nausea.  No fevers or chills.  She tried a laxative this morning which she states was lemon tasting.  She could not tell me what the name of the laxative was.  Patient denies any vomiting.  Denies any dysuria.  Denies any loss of appetite.   Review of Symptoms - see HPI PMH - Smoking status noted.    Objective: BP 120/72   Pulse 80   Temp 98.3 F (36.8 C) (Oral)   Ht 5\' 4"  (1.626 m)   Wt 138 lb 6.4 oz (62.8 kg)   SpO2 99%   BMI 23.76 kg/m  Gen: NAD, alert, cooperative with exam Cardio: regular rate and rhythm, S1S2 heard, no murmurs appreciated Pulm: clear to auscultation bilaterally, no wheezes, rhonchi or rales GI: Mild epigastric tenderness,  non-distended, bowel sounds present, no hepatomegaly, no splenomegaly Skin: dry, intact, no rashes or lesions   Assessment/Plan: See problem based a/p  Constipation -To take 1 packet of MiraLAX tonight and then 2 packets of MiraLAX tomorrow and then 3 packets of MiraLAX the following day until she has a bowel movement - polyethylene glycol (MIRALAX / GLYCOLAX) packet; Take 17 g by mouth daily.  Dispense: 24 each; Refill: 0 -Discussed food recommendations and also possibly adding prune juice to this regiment    Gastroesophageal reflux disease without esophagitis Reflux-like symptoms  -We will provide patient with a GI cocktail during visit -Pepcid twice daily for the next 2 weeks -Follow-up in 2-3 days if no improvement in patient's symptoms

## 2017-09-22 ENCOUNTER — Ambulatory Visit: Payer: Medicaid Other | Admitting: Internal Medicine

## 2017-09-24 ENCOUNTER — Encounter (HOSPITAL_COMMUNITY): Payer: Self-pay

## 2017-09-24 ENCOUNTER — Emergency Department (HOSPITAL_COMMUNITY)
Admission: EM | Admit: 2017-09-24 | Discharge: 2017-09-24 | Disposition: A | Discharge status: Home / Self Care | Payer: Medicaid Other | Attending: Physician Assistant | Admitting: Physician Assistant

## 2017-09-24 ENCOUNTER — Emergency Department (HOSPITAL_COMMUNITY): Payer: Medicaid Other

## 2017-09-24 ENCOUNTER — Other Ambulatory Visit: Payer: Self-pay

## 2017-09-24 DIAGNOSIS — R079 Chest pain, unspecified: Secondary | ICD-10-CM | POA: Diagnosis present

## 2017-09-24 DIAGNOSIS — J019 Acute sinusitis, unspecified: Secondary | ICD-10-CM | POA: Diagnosis not present

## 2017-09-24 DIAGNOSIS — R0789 Other chest pain: Secondary | ICD-10-CM | POA: Diagnosis not present

## 2017-09-24 DIAGNOSIS — J329 Chronic sinusitis, unspecified: Secondary | ICD-10-CM

## 2017-09-24 DIAGNOSIS — Z79899 Other long term (current) drug therapy: Secondary | ICD-10-CM | POA: Insufficient documentation

## 2017-09-24 LAB — BASIC METABOLIC PANEL
Anion gap: 10 (ref 5–15)
BUN: 10 mg/dL (ref 6–20)
CHLORIDE: 103 mmol/L (ref 101–111)
CO2: 25 mmol/L (ref 22–32)
Calcium: 9.8 mg/dL (ref 8.9–10.3)
Creatinine, Ser: 0.87 mg/dL (ref 0.44–1.00)
GFR calc non Af Amer: >60 mL/min (ref >60)
Glucose, Bld: 105 mg/dL — ABNORMAL HIGH (ref 65–99)
Potassium: 3.7 mmol/L (ref 3.5–5.1)
Sodium: 138 mmol/L (ref 135–145)

## 2017-09-24 LAB — I-STAT BETA HCG BLOOD, ED (MC, WL, AP ONLY): I-stat hCG, quantitative: <5 m[IU]/mL (ref <5)

## 2017-09-24 LAB — CBC
HEMATOCRIT: 39.2 % (ref 36.0–46.0)
Hemoglobin: 12.9 g/dL (ref 12.0–15.0)
MCH: 30.3 pg (ref 26.0–34.0)
MCHC: 32.9 g/dL (ref 30.0–36.0)
MCV: 92 fL (ref 78.0–100.0)
Platelets: 376 10*3/uL (ref 150–400)
RBC: 4.26 MIL/uL (ref 3.87–5.11)
RDW: 14.6 % (ref 11.5–15.5)
WBC: 5.3 10*3/uL (ref 4.0–10.5)

## 2017-09-24 LAB — I-STAT TROPONIN, ED: Troponin i, poc: 0 ng/mL (ref 0.00–0.08)

## 2017-09-24 MED ORDER — AMOXICILLIN-POT CLAVULANATE 875-125 MG PO TABS: 1.0000 | ORAL_TABLET | Freq: Two times a day (BID) | ORAL | 0 refills | Status: AC

## 2017-09-24 NOTE — Discharge Instructions (Signed)
Take antibiotics as prescribed. Take the entire course, even if your symptoms improve.  Continue taking medications as prescribed. Follow-up with your primary care doctor on Monday at your scheduled appointment for further evaluation. Return to the emergency room if you develop persistent chest pain, difficulty breathing, or any new or concerning symptoms.

## 2017-09-24 NOTE — ED Provider Notes (Signed)
[facility]ENCY [facility] Provider Note   CSN: [id number] Arrival date & time: 09/24/17  1048     History   Chief Complaint No chief complaint on file.   HPI Claudia Washington is a 37 y.o. female presenting for evaluation of chest pressure, facial soreness, and nasal congestion.  Patient states for the past month, she has had persistent nasal congestion.  This is not improved with Flonase.  Last week, she developed chest pressure.  It is intermittent, lasting for a few minutes before resolving without intervention.  Patient reports pressure improves when she hugs herself.  Several days ago, patient hit herself in the left side of her jaw when she hugged herself, and since then she has had left-sided jaw soreness.  Patient states she has been evaluated by her primary care doctor for the chest pressure, who believed this was likely due to GERD.  Patient currently without chest pain or pressure.  She denies associated shortness of breath, nausea, vomiting, or abdominal pain.  She denies recent fevers, sore throat, cough.  Patient without history of DVT or cancer.  No recent travel, immobilization, surgery, trauma, OCP use.  No cardiac history, no family history of cardiac problems.  She is a never smoker.    HPI  Past Medical History:  Diagnosis Date  . Anemia   . Irregular menses   . Superficial thrombophlebitis     Patient Active Problem List   Diagnosis Date Noted  . Constipation 09/19/2017  . Gastroesophageal reflux disease without esophagitis 09/19/2017  . Acute pain of right shoulder 05/13/2017    Past Surgical History:  Procedure Laterality Date  . GANGLION CYST EXCISION    . TUBAL LIGATION  2008    OB History    Gravida Para Term Preterm AB Living   5 5 4 1   5    SAB TAB Ectopic Multiple Live Births                   Home Medications    Prior to Admission medications   Medication Sig Start Date End Date Taking? Authorizing Provider    amoxicillin-clavulanate (AUGMENTIN) 875-125 MG tablet Take 1 tablet by mouth every 12 (twelve) hours for 10 days. 09/24/17 10/04/17  Yichen Gilardi, PA-C  famotidine (PEPCID) 20 MG tablet Take 1 tablet (20 mg total) by mouth 2 (two) times daily. 09/19/17   Berton BonMikell, Asiyah Zahra, MD  meloxicam (MOBIC) 15 MG tablet 1 po q d x 3 weeks 06/29/17   Cammy Copaean, Gregory Scott, MD  oxymetazoline [facility](AFRIN NASAL SPRAY) 0.05 % nasal spray Place 1 spray into both nostrils 2 (two) times daily. 08/09/17   Evon SlackGaines, Thomas C, PA-C  polyethylene glycol (MIRALAX / GLYCOLAX) packet Take 17 g by mouth daily. 09/19/17   Mikell, Antionette PolesAsiyah Zahra, MD  pseudoephedrine (SUDAFED) 60 MG tablet Take 1 tablet (60 mg total) by mouth every 4 (four) hours as needed for congestion. 08/09/17   Evon SlackGaines, Thomas C, PA-C    Family History Family History  Problem Relation Age of Onset  . Hypertension Mother   . Diabetes Maternal Grandmother   . Stroke Maternal Grandmother   . Hypertension Maternal Grandmother   . Depression Paternal Grandmother   . Asthma Sister   . Asthma Brother     Social History Social History   Tobacco Use  . Smoking status: Never Smoker  . Smokeless tobacco: Never Used  Substance Use Topics  . Alcohol use: No  . Drug use: No  Allergies   Patient has no known allergies.   Review of Systems Review of Systems  HENT:       Left-sided jaw soreness  Cardiovascular:       Chest pressure  All other systems reviewed and are negative.    Physical Exam Updated Vital Signs BP 123/70   Pulse 98   Temp 98.1 F (36.7 C) (Oral)   Resp 16   SpO2 100%   Physical Exam  Constitutional: She is oriented to person, place, and time. She appears well-developed and well-nourished. No distress.  HENT:  Head: Normocephalic and atraumatic.  Right Ear: Tympanic membrane, external ear and ear canal normal.  Left Ear: Tympanic membrane, external ear and ear canal normal.  Nose: Mucosal edema present.  Mouth/Throat:  Uvula is midline, oropharynx is clear and moist and mucous membranes are normal.  Nasal mucosal edema.  OP clear without tonsillar swelling or exudate.  Uvula midline with equal palate rise.  TMs nonerythematous and not bulging bilaterally.  No obvious swelling, deformity, or injury of the left-sided jaw.  Eyes: EOM are normal. Pupils are equal, round, and reactive to light.  Neck: Normal range of motion. Neck supple.  Cardiovascular: Normal rate, regular rhythm and intact distal pulses.  Pulmonary/Chest: Effort normal and breath sounds normal. No respiratory distress. She has no wheezes. She exhibits no tenderness.  Abdominal: Soft. She exhibits no distension and no mass. There is no tenderness. There is no guarding.  Musculoskeletal: Normal range of motion.  Neurological: She is alert and oriented to person, place, and time.  Skin: Skin is warm and dry.  Psychiatric: She has a normal mood and affect.  Nursing note and vitals reviewed.    ED Treatments / Results  Labs (all labs ordered are listed, but only abnormal results are displayed) Labs Reviewed  BASIC METABOLIC PANEL - Abnormal; Notable for the following components:      Result Value   Glucose, Bld 105 (*)    All other components within normal limits  CBC  I-STAT TROPONIN, ED  I-STAT BETA HCG BLOOD, ED ([facility], [facility], [facility] ONLY)    EKG  EKG Interpretation  Date/Time:  Saturday September 24 2017 11:16:18 EDT Ventricular Rate:  85 PR Interval:  120 QRS Duration: 82 QT Interval:  338 QTC Calculation: 402 R Axis:   79 Text Interpretation:  Normal sinus rhythm Biatrial enlargement Nonspecific T wave abnormality Abnormal ECG Normal sinus rhythm Confirmed by Corlis Leak, Courteney ([id number]) on 09/24/2017 11:55:04 AM       Radiology Dg Chest 2 View  Result Date: 09/24/2017 CLINICAL DATA:  Patient complains of 1 week of intermittent CP x 1 week. Seen at primary MD and diagnosed with GERD. States that the pain resolves when she holds her  chest tight, EXAM: CHEST - 2 VIEW COMPARISON:  None. FINDINGS: Normal mediastinum and cardiac silhouette. Normal pulmonary vasculature. No evidence of effusion, infiltrate, or pneumothorax. No acute bony abnormality. IMPRESSION: Normal chest radiograph Electronically Signed   By: Genevive Bi M.D.   On: 09/24/2017 11:34    Procedures Procedures (including critical care time)  Medications Ordered in ED Medications - No data to display   Initial Impression / Assessment and Plan / ED Course  I have reviewed the triage vital signs and the nursing notes.  Pertinent labs & imaging results that were available during my care of the patient were reviewed by me and considered in my medical decision making (see chart for details).     Patient  presenting for evaluation of the week of intermittent chest pressure, left-sided facial soreness, nasal congestion.  Physical exam shows nasal mucosal edema.  ?sinusitis, will give abx for possible.  Cardiac workup reassuring, chest x-ray reviewed and interpreted by me.  No sign of pneumonia or other acute abnormality.  Troponin negative.  EKG without signs of STEMI.  Discussed findings with patient, patient states knowing this her chest pressure has improved.  At this time, doubt ACS, PE, endocarditis, myocarditis.  Discussed findings and results with patient.  Discussed importance of follow-up with primary care.  Patient states she has an appointment with her primary care doctor on Monday.  At this time, patient appears safe for discharge.  Return precautions given.  Patient states he understands and agrees to plan.   Final Clinical Impressions(s) / ED Diagnoses   Final diagnoses:  Atypical chest pain  Sinusitis, unspecified chronicity, unspecified location    ED Discharge Orders        Ordered    amoxicillin-clavulanate (AUGMENTIN) 875-125 MG tablet  Every 12 hours     09/24/17 1250       Aerabella Galasso, PA-C 09/24/17 1602    Abelino Derrick, MD 09/25/17 1141

## 2017-09-24 NOTE — ED Triage Notes (Signed)
Patient complains of 1 week of intermittent CP x 1 week. Seen at primary MD and diagnosed with GERD. States that the pain resolves when she holds her chest tight, NAD

## 2017-09-25 NOTE — Progress Notes (Signed)
   [facility] Phone: [phone]   Date of Visit: 09/26/2017   HPI:  Chest Pain:  - reports of intermittent left chest pain that started last Monday  - pain is intermittent but does not have a pattern to it - can occur at rest. Does not worsen with activity particularly  - sometimes sleeping on the left side helps with the symptom - describes as an achy/pressure like sensation - has not tried any medications for this - no shortness of breath, nausea, or diaphoresis - no family history of heart disease - denies tobacco use but does report of marijuana use - reports that it initially started with abdominal bloating then traveled up to chest. She was seen for her abdominal symptoms earlier this month and was given Famotidine. She did not notice much change in her symptoms with this.  - she was seen in the ED on 3/16. EKG was NSR without significant ST changes. CXR was unremarkable. istat troponin negative. - she was also prescribed Augmentin from the ED for possible sinusitis. She started this yesterday. No fevers and no shortness of breath.    ROS: See HPI.  PMFSH:  PMH: GERD  PHYSICAL EXAM: BP 98/70 (BP Location: Left Arm, Patient Position: Sitting, Cuff Size: Normal)   Pulse 79   Temp 97.9 F (36.6 C) (Oral)   Ht 5\' 4"  (1.626 m)   Wt 135 lb 9.6 oz (61.5 kg)   LMP 09/09/2017 (Approximate)   SpO2 100%   BMI 23.28 kg/m  GEN: NAD HEENT: Atraumatic, normocephalic, neck supple, EOMI, sclera clear  CV: RRR, tenderness to palpation over the left sternal border as well as the upper border of the left pectoralis major muscle. This is the chest pain that patient describes (reproducible) Also there is some tenderness to palpation of the left posterior shoulder that is not consistently reproducible PULM: CTAB, normal effort MSK: no shoulder asymmetry, normal ROM of the shoulder without any pain. Strength 5/5 in upper extremities including internal and external  rotation against resistance. neer test negative. Mild pain with hawkin sign on the left upper extremity SKIN: No rash or cyanosis; warm and well-perfused EXTR: No lower extremity edema or calf tenderness PSYCH: Mood and affect euthymic, normal rate and volume of speech NEURO: Awake, alert, no focal deficits grossly, normal speech   ASSESSMENT/PLAN:  1. Costochondritis Her chest discomfort is reproducible to palpation of the chest wall. Most consistent with costochondritis. Recommend Naproxen 500mg  BID x 5 days then as needed. Recommended taking with food. Recommended continuing Augmenting for her sinusitis. Follow up in 1-2 weeks if symptoms do not improve. Unlikely her chest discomfort is cardiac in etiology as it is atypical, reproducible; additionally no risk factors or FH of early heart disease. Not consistent with PE or GERD. Since famotidine that was started earlier this month has not made any improvement in symptoms, patient can discontinue.    Claudia HolterKanishka G Gunadasa, MD PGY 3 [facility]

## 2017-09-26 ENCOUNTER — Encounter: Payer: Self-pay | Admitting: Internal Medicine

## 2017-09-26 ENCOUNTER — Ambulatory Visit (INDEPENDENT_AMBULATORY_CARE_PROVIDER_SITE_OTHER): Payer: Medicaid Other | Admitting: Internal Medicine

## 2017-09-26 VITALS — BP 98/70 | HR 79 | Temp 97.9°F | Ht 64.0 in | Wt 135.6 lb

## 2017-09-26 DIAGNOSIS — M940 Chondrocostal junction syndrome [Tietze]: Secondary | ICD-10-CM | POA: Diagnosis not present

## 2017-09-26 MED ORDER — NAPROXEN 500 MG PO TABS: 500.0000 mg | ORAL_TABLET | Freq: Two times a day (BID) | ORAL | 0 refills | Status: DC

## 2017-09-26 NOTE — Patient Instructions (Signed)
Let's try Naproxen twice a day for 5 days then as needed only.   Costochondritis Costochondritis is swelling and irritation (inflammation) of the tissue (cartilage) that connects your ribs to your breastbone (sternum). This causes pain in the front of your chest. Usually, the pain:  Starts gradually.  Is in more than one rib.  This condition usually goes away on its own over time. Follow these instructions at home:  Do not do anything that makes your pain worse.  If directed, put ice on the painful area: ? Put ice in a plastic bag. ? Place a towel between your skin and the bag. ? Leave the ice on for 20 minutes, 2-3 times a day.  If directed, put heat on the affected area as often as told by your doctor. Use the heat source that your doctor tells you to use, such as a moist heat pack or a heating pad. ? Place a towel between your skin and the heat source. ? Leave the heat on for 20-30 minutes. ? Take off the heat if your skin turns bright red. This is very important if you cannot feel pain, heat, or cold. You may have a greater risk of getting burned.  Take over-the-counter and prescription medicines only as told by your doctor.  Return to your normal activities as told by your doctor. Ask your doctor what activities are safe for you.  Keep all follow-up visits as told by your doctor. This is important. Contact a doctor if:  You have chills or a fever.  Your pain does not go away or it gets worse.  You have a cough that does not go away. Get help right away if:  You are short of breath. This information is not intended to replace advice given to you by your health care provider. Make sure you discuss any questions you have with your health care provider. Document Released: 12/15/2007 Document Revised: 01/16/2016 Document Reviewed: 10/22/2015 Elsevier Interactive Patient Education  [employer]2018 [employer]

## 2017-10-03 NOTE — Progress Notes (Signed)
   Subjective:   Patient ID: Claudia Washington    DOB: May 25, 1981, 37 y.o. female   MRN: [id number]  Claudia Nipplearis L Connole is a 37 y.o. female with no significant chronic medical history here for   Abdominal discomfort - 3 weeks ago started having LUQ/epigastric paan and gas, with radiation to chest. Went to ED with resultant negative cardiac work up. Given famotidine in the clinic 3/11 for reflux symptoms, discontinued a week later b/c she didn't feel it helped. Seen in the clinic 3/18, pain seemed to be more costochondritis, given Naproxen which she hasn't taken. - Has recently had issues with constipation, but has found relief with Miralax. - Patient states pain feels like "discomfort/pressure." Placing hands on chest would ease it but will come back a few hours later. Has been eating chicken and salad in small portions which helps. Tried a few bites of steak but felt that it made her pain worse.  - States Tums relieves her pain. - Has tried gas ex without relief. - Endorses feeling the pain radiate up her chest and feels the need to gag or vomit sometimes, also endorsing loss of appetite. Denies fevers, recent travel.  Review of Systems:  Per HPI.   PMFSH: reviewed. Smoking status reviewed. Medications reviewed.  Objective:   BP 102/78   Pulse 85   Temp (!) 97.5 F (36.4 C) (Oral)   Ht 5\' 4"  (1.626 m)   Wt 130 lb 12.8 oz (59.3 kg)   LMP 09/09/2017 (Approximate)   SpO2 99%   BMI 22.45 kg/m  Vitals and nursing note reviewed.  General: well nourished, well developed, in no acute distress with non-toxic appearance HEENT: normocephalic, atraumatic, moist mucous membranes CV: regular rate and rhythm without murmurs, rubs, or gallops Lungs: clear to auscultation bilaterally with normal work of breathing Abdomen: soft, non-tender, non-distended, no masses or organomegaly palpable, normoactive bowel sounds Skin: warm, dry, no rashes or lesions Extremities: warm and well perfused, normal tone MSK:  ROM grossly intact, strength intact, gait normal Neuro: Alert and oriented, speech normal  Assessment & Plan:   Gastroesophageal reflux disease without esophagitis Symptoms consistent with acid reflux given timing and exacerbating factors of pain with relief from Tums. No current pain today and no reproduction of pain on exam today. Will trial Omeprazole given no significant relief from Famotidine. Will have patient not take Naproxen to avoid exacerbating reflux. Reviewed possible aggravating foods, handout given. Will have patient follow up in a week or so if still no relief.  No orders of the defined types were placed in this encounter.  Meds ordered this encounter  Medications  . omeprazole (PRILOSEC) 40 MG capsule    Sig: Take 1 capsule (40 mg total) by mouth daily.    Dispense:  30 capsule    Refill:  0    Ellwood DenseAlison Rumball, DO PGY-1, [facility] 10/05/2017 3:14 PM

## 2017-10-05 ENCOUNTER — Ambulatory Visit (INDEPENDENT_AMBULATORY_CARE_PROVIDER_SITE_OTHER): Payer: Medicaid Other | Admitting: Family Medicine

## 2017-10-05 ENCOUNTER — Encounter: Payer: Self-pay | Admitting: Family Medicine

## 2017-10-05 ENCOUNTER — Other Ambulatory Visit: Payer: Self-pay

## 2017-10-05 VITALS — BP 102/78 | HR 85 | Temp 97.5°F | Ht 64.0 in | Wt 130.8 lb

## 2017-10-05 DIAGNOSIS — K219 Gastro-esophageal reflux disease without esophagitis: Secondary | ICD-10-CM | POA: Diagnosis present

## 2017-10-05 MED ORDER — OMEPRAZOLE 40 MG PO CPDR: 40.0000 mg | DELAYED_RELEASE_CAPSULE | Freq: Every day | ORAL | 0 refills | Status: DC

## 2017-10-05 NOTE — Assessment & Plan Note (Signed)
Symptoms consistent with acid reflux given timing and exacerbating factors of pain with relief from Tums. No current pain today and no reproduction of pain on exam today. Will trial Omeprazole given no significant relief from Famotidine. Will have patient not take Naproxen to avoid exacerbating reflux. Reviewed possible aggravating foods, handout given. Will have patient follow up in a week or so if still no relief.

## 2017-10-05 NOTE — Patient Instructions (Signed)
It was great to see you!  For your abdominal pain,  - Your symptoms are consistent with acid reflux.  - We will try a different medication called Prilosec. You will take this medication daily. - Follow up in a couple weeks to see how you are doing. If you are still not having relief at that time, we can do a more extensive workup.  Take care and seek immediate care sooner if you develop any concerns.   Ellwood DenseAlison Tahni Porchia, DO [facility]   Gastroesophageal Reflux Disease, Adult Normally, food travels down the esophagus and stays in the stomach to be digested. If a person has gastroesophageal reflux disease (GERD), food and stomach acid move back up into the esophagus. When this happens, the esophagus becomes sore and swollen (inflamed). Over time, GERD can make small holes (ulcers) in the lining of the esophagus. Follow these instructions at home: Diet  Follow a diet as told by your doctor. You may need to avoid foods and drinks such as: ? Coffee and tea (with or without caffeine). ? Drinks that contain alcohol. ? Energy drinks and sports drinks. ? Carbonated drinks or sodas. ? Chocolate and cocoa. ? Peppermint and mint flavorings. ? Garlic and onions. ? Horseradish. ? Spicy and acidic foods, such as peppers, chili powder, curry powder, vinegar, hot sauces, and BBQ sauce. ? Citrus fruit juices and citrus fruits, such as oranges, lemons, and limes. ? Tomato-based foods, such as red sauce, chili, salsa, and pizza with red sauce. ? Fried and fatty foods, such as donuts, french fries, potato chips, and high-fat dressings. ? High-fat meats, such as hot dogs, rib eye steak, sausage, ham, and bacon. ? High-fat dairy items, such as whole milk, butter, and cream cheese.  Eat small meals often. Avoid eating large meals.  Avoid drinking large amounts of liquid with your meals.  Avoid eating meals during the 2-3 hours before bedtime.  Avoid lying down right after you eat.  Do not  exercise right after you eat. General instructions  Pay attention to any changes in your symptoms.  Take over-the-counter and prescription medicines only as told by your doctor. Do not take aspirin, ibuprofen, or other NSAIDs unless your doctor says it is okay.  Do not use any tobacco products, including cigarettes, chewing tobacco, and e-cigarettes. If you need help quitting, ask your doctor.  Wear loose clothes. Do not wear anything tight around your waist.  Raise (elevate) the head of your bed about 6 inches (15 cm).  Try to lower your stress. If you need help doing this, ask your doctor.  If you are overweight, lose an amount of weight that is healthy for you. Ask your doctor about a safe weight loss goal.  Keep all follow-up visits as told by your doctor. This is important. Contact a doctor if:  You have new symptoms.  You lose weight and you do not know why it is happening.  You have trouble swallowing, or it hurts to swallow.  You have wheezing or a cough that keeps happening.  Your symptoms do not get better with treatment.  You have a hoarse voice. Get help right away if:  You have pain in your arms, neck, jaw, teeth, or back.  You feel sweaty, dizzy, or light-headed.  You have chest pain or shortness of breath.  You throw up (vomit) and your throw up looks like blood or coffee grounds.  You pass out (faint).  Your poop (stool) is bloody or black.  You cannot swallow, drink, or eat. This information is not intended to replace advice given to you by your health care provider. Make sure you discuss any questions you have with your health care provider. Document Released: 12/15/2007 Document Revised: 12/04/2015 Document Reviewed: 10/23/2014 Elsevier Interactive Patient Education  [employer].

## 2017-12-30 ENCOUNTER — Telehealth: Payer: Self-pay | Admitting: Family Medicine

## 2017-12-30 NOTE — Telephone Encounter (Signed)
Pt recieves SSI . She currently has a payee. She wants to start getting the checks herself.  She needs a letter from Dr Linwood Dibblesumball stating she can handle her own money. Please call pt when the ready is ready for pickup. Alternate number is [phone]

## 2017-12-30 NOTE — Telephone Encounter (Signed)
Will forward to MD to advise. Prestyn Mahn,CMA  

## 2018-01-02 ENCOUNTER — Encounter: Payer: Self-pay | Admitting: Family Medicine

## 2018-01-02 NOTE — Telephone Encounter (Signed)
Left voicemail for patient to call back. 

## 2018-01-02 NOTE — Telephone Encounter (Signed)
Pt was calling back returning Dr Madelaine Etienneumball's phone call.

## 2018-01-02 NOTE — Telephone Encounter (Signed)
Spoke with patient regarding requirements for the letter. 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 SS is the current payee. She is requesting the payee be changed to herself as she has had frequent late payments for bills. She is not requesting updated evaluation for eligibility.  Letter outlining this change based on my interactions with her is complete and ready for patient pickup in the front office.

## 2018-02-15 ENCOUNTER — Ambulatory Visit: Payer: Medicaid Other

## 2018-02-16 ENCOUNTER — Ambulatory Visit: Payer: Medicaid Other | Admitting: Family Medicine

## 2018-02-18 NOTE — Progress Notes (Deleted)
  Subjective:   Patient ID: Claudia NippleParis L Cardarelli    DOB: [birth year], 37 y.o. female   MRN: [id number]  Claudia Washington is a 37 y.o. female with a history of GERD, constipation here for   BACK PAIN Back pain began *** days ago. Pain is described as ***. Patient has tried ***. Pain radiates ***. History of trauma or injury: *** Patient believes might be causing their pain: ***  Prior history of similar pain: *** History of cancer: *** Weak immune system:  *** History of IV drug use: *** History of steroid use: ***  Symptoms Incontinence of bowel or bladder:  *** Numbness of leg: *** Fever: *** Rest or Night pain: *** Weight Loss:  **** Rash: ***   Review of Systems:  Per HPI.  PMFSH, medications and smoking status reviewed.  Objective:   There were no vitals taken for this visit. Vitals and nursing note reviewed.  General: well nourished, well developed, in no acute distress with non-toxic appearance HEENT: normocephalic, atraumatic, moist mucous membranes Neck: supple, non-tender without lymphadenopathy CV: regular rate and rhythm without murmurs, rubs, or gallops, no lower extremity edema Lungs: clear to auscultation bilaterally with normal work of breathing Abdomen: soft, non-tender, non-distended, no masses or organomegaly palpable, normoactive bowel sounds Skin: warm, dry, no rashes or lesions Extremities: warm and well perfused, normal tone MSK: ROM grossly intact, strength intact, gait normal Neuro: Alert and oriented, speech normal  Assessment & Plan:   No problem-specific Assessment & Plan notes found for this encounter.  No orders of the defined types were placed in this encounter.  No orders of the defined types were placed in this encounter.   Ellwood DenseAlison Tylie Golonka, DO PGY-2, [facility] 02/18/2018 9:54 AM

## 2018-02-20 ENCOUNTER — Ambulatory Visit: Payer: Medicaid Other | Admitting: Family Medicine

## 2018-03-03 ENCOUNTER — Ambulatory Visit: Payer: Medicaid Other | Admitting: Family Medicine

## 2018-03-07 ENCOUNTER — Ambulatory Visit: Payer: Medicaid Other | Admitting: Family Medicine

## 2018-03-21 ENCOUNTER — Ambulatory Visit: Payer: Medicaid Other | Admitting: Family Medicine

## 2018-03-22 ENCOUNTER — Ambulatory Visit (INDEPENDENT_AMBULATORY_CARE_PROVIDER_SITE_OTHER): Payer: Medicaid Other | Admitting: Family Medicine

## 2018-03-22 ENCOUNTER — Other Ambulatory Visit: Payer: Self-pay

## 2018-03-22 VITALS — BP 108/70 | HR 79 | Temp 98.0°F | Wt 131.0 lb

## 2018-03-22 DIAGNOSIS — M67911 Unspecified disorder of synovium and tendon, right shoulder: Secondary | ICD-10-CM

## 2018-03-22 DIAGNOSIS — M549 Dorsalgia, unspecified: Secondary | ICD-10-CM | POA: Diagnosis not present

## 2018-03-22 DIAGNOSIS — R143 Flatulence: Secondary | ICD-10-CM | POA: Insufficient documentation

## 2018-03-22 MED ORDER — OMEPRAZOLE 40 MG PO CPDR: 40.0000 mg | DELAYED_RELEASE_CAPSULE | Freq: Every day | ORAL | 0 refills | Status: DC

## 2018-03-22 MED ORDER — SIMETHICONE 80 MG PO CHEW
80.0000 mg | CHEWABLE_TABLET | Freq: Four times a day (QID) | ORAL | 0 refills | Status: DC | PRN
Start: 2018-03-22 — End: 2018-05-24

## 2018-03-22 NOTE — Assessment & Plan Note (Signed)
  Patient reports intermittent gas pains and bloating. Rx for simethicone given to use as needed. Asked patient to keep a food diary to track when this happens. Refilled prilosec to use for reflux that was given by PCP. Discussed NSAIDs may upset stomach and to discontinue their use if that happens. Follow up with PCP. The patient agrees with the plan.
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
## 2018-03-22 NOTE — Assessment & Plan Note (Signed)
  This is a chronic issue for the patient. Had MRI in fall of 2018 that demonstrated supraspinatus and infraspinatus tendinopathy without tear. Has been seen by ortho in past, steroid shot in right shoulder provided no relief. Would not do this again as it didn't work last time. Discussed conservative measures including tylenol, ibuprofen, heating pad, and topical muscle rubs. Encouraged patient to try PT for strengthening exercises and stretches. She is agreeable to doing this. Referral made to PT. Follow up with PCP In 4 weeks to assess how conservative measures are working.

## 2018-03-22 NOTE — Patient Instructions (Addendum)
   Try to keep a food diary- when you feel like you have bad gas or bloating, write down what you have eaten or drank recently to keep track. If you see a pattern try to eliminate that food.  Try tylenol or ibuprofen for back pain. I would highly recommend getting a heating pad. This will help loosen muscles and relieve pain. Please use over the counter muscle rubs to help as well.  I referred you to physical therapy, you will be called to make an appointment.   We'll see you back in 4 weeks to see how you are doing.  If you have questions or concerns please do not hesitate to call at [phone].  Dolores Patty, DO PGY-3, [facility] 03/22/2018 10:48 AM

## 2018-03-22 NOTE — Assessment & Plan Note (Signed)
  Pain likely MSK in origin. Patient initially requested MRI. Discussed this is unlikely to show [place] anything that would change management. She has no evidence of nerve involvement. Again discussed conservative treatments and PT. Follow up 4 weeks.

## 2018-04-05 ENCOUNTER — Ambulatory Visit: Payer: Medicaid Other | Attending: Family Medicine | Admitting: Physical Therapy

## 2018-04-07 ENCOUNTER — Ambulatory Visit (INDEPENDENT_AMBULATORY_CARE_PROVIDER_SITE_OTHER): Payer: Medicaid Other

## 2018-04-07 ENCOUNTER — Encounter (HOSPITAL_COMMUNITY): Payer: Self-pay | Admitting: Urgent Care

## 2018-04-07 ENCOUNTER — Other Ambulatory Visit: Payer: Self-pay

## 2018-04-07 ENCOUNTER — Ambulatory Visit (HOSPITAL_COMMUNITY)
Admission: EM | Admit: 2018-04-07 | Discharge: 2018-04-07 | Disposition: A | Discharge status: Home / Self Care | Payer: Medicaid Other | Attending: Urgent Care | Admitting: Urgent Care

## 2018-04-07 DIAGNOSIS — R1084 Generalized abdominal pain: Secondary | ICD-10-CM

## 2018-04-07 DIAGNOSIS — K5900 Constipation, unspecified: Secondary | ICD-10-CM

## 2018-04-07 DIAGNOSIS — R140 Abdominal distension (gaseous): Secondary | ICD-10-CM

## 2018-04-07 NOTE — ED Provider Notes (Signed)
MRN: [id number] DOB: 1980/09/11  Subjective:   Claudia Washington is a 37 y.o. female presenting for 4 day history of gas, constipation (last bowel movement was ~1 week ago), decreased appetite. Generally defecates once every other day. Denies fever, n/v, abdominal pain. She ate at a hot Haemat[employer] and has had problems since then, had abdominal cramping since then. She is passing some gas but is very little and her abdominal discomfort is worsening.  She did try a stool softener which has not helped.  Patient practices a healthy diet including adequate hydration, plenty of fiber.  Surgical history is significant for history of tubal ligation.  reports that she has never smoked. She has never used smokeless tobacco. She reports that she does not drink alcohol or use drugs.   No current facility-administered medications for this encounter.   Current Outpatient Medications:  .  omeprazole (PRILOSEC) 40 MG capsule, Take 1 capsule (40 mg total) by mouth daily., Disp: 30 capsule, Rfl: 0 .  simethicone (MYLICON) 80 MG chewable tablet, Chew 1 tablet (80 mg total) by mouth every 6 (six) hours as needed for flatulence. (Patient not taking: Reported on 04/07/2018), Disp: 30 tablet, Rfl: 0    No Known Allergies   Past Medical History:  Diagnosis Date  . Anemia   . Irregular menses   . Superficial thrombophlebitis      Past Surgical History:  Procedure Laterality Date  . GANGLION CYST EXCISION    . TUBAL LIGATION  2008    Objective:   Vitals: BP 119/81   Pulse (!) 102   Temp 97.6 F (36.4 C)   Resp 16   Wt 130 lb (59 kg)   LMP 03/24/2018   SpO2 99%   BMI 22.31 kg/m   Physical Exam  Constitutional: She is oriented to person, place, and time. She appears well-developed and well-nourished.  Cardiovascular: Normal rate, regular rhythm and intact distal pulses. Exam reveals no gallop and no friction rub.  No murmur heard. Pulmonary/Chest: No respiratory distress. She has no wheezes. She has  no rales.  Abdominal: Soft. Bowel sounds are normal. She exhibits distension (slight). She exhibits no mass. There is tenderness in the right lower quadrant, suprapubic area and left lower quadrant. There is no rigidity, no rebound, no guarding and no CVA tenderness.  Musculoskeletal: She exhibits no edema.  Neurological: She is alert and oriented to person, place, and time.  Skin: Skin is warm and dry. No rash noted. No erythema. No pallor.    Dg Abd 2 Views  Result Date: 04/07/2018 CLINICAL DATA:  Severe constipation.  Rule out bowel obstruction EXAM: ABDOMEN - 2 VIEW COMPARISON:  None. FINDINGS: No significant increase in stool burden within the colon. Nonobstructive bowel gas pattern. No free air organomegaly. No suspicious calcification. Visualized lung bases clear. No bony abnormality. IMPRESSION: Negative. Electronically Signed   By: Charlett Nose M.D.   On: 04/07/2018 09:27     Assessment and Plan :   Constipation, unspecified constipation type  Generalized abdominal pain  Gassiness  Patient has regular bowel movements but has not had one in a week and may have been precipitated by eating the restaurant food.  I counseled that for now we should try to manage her symptoms for constipation with using a Fleet enema.  Counseled that I would like to avoid her using MiraLAX so that we do not worsen her symptom of feeling gas.  ER and return to clinic precautions reviewed.  Wallis Bamberg, [place] 04/07/18 [phone]

## 2018-04-07 NOTE — ED Triage Notes (Signed)
Pt states she has trapped gas in her stomach x 4 days.

## 2018-04-09 ENCOUNTER — Encounter (HOSPITAL_COMMUNITY): Payer: Self-pay | Admitting: Emergency Medicine

## 2018-04-09 ENCOUNTER — Emergency Department (HOSPITAL_COMMUNITY)
Admission: EM | Admit: 2018-04-09 | Discharge: 2018-04-09 | Disposition: A | Discharge status: Home / Self Care | Payer: Medicaid Other | Attending: Emergency Medicine | Admitting: Emergency Medicine

## 2018-04-09 ENCOUNTER — Other Ambulatory Visit: Payer: Self-pay

## 2018-04-09 DIAGNOSIS — R319 Hematuria, unspecified: Secondary | ICD-10-CM | POA: Diagnosis not present

## 2018-04-09 DIAGNOSIS — K5900 Constipation, unspecified: Secondary | ICD-10-CM

## 2018-04-09 DIAGNOSIS — R1032 Left lower quadrant pain: Secondary | ICD-10-CM | POA: Diagnosis not present

## 2018-04-09 DIAGNOSIS — R109 Unspecified abdominal pain: Secondary | ICD-10-CM | POA: Diagnosis not present

## 2018-04-09 DIAGNOSIS — N390 Urinary tract infection, site not specified: Secondary | ICD-10-CM | POA: Insufficient documentation

## 2018-04-09 DIAGNOSIS — Z79899 Other long term (current) drug therapy: Secondary | ICD-10-CM | POA: Insufficient documentation

## 2018-04-09 LAB — URINALYSIS, ROUTINE W REFLEX MICROSCOPIC
Bacteria, UA: NONE SEEN
Bilirubin Urine: NEGATIVE
GLUCOSE, UA: NEGATIVE mg/dL
Hgb urine dipstick: NEGATIVE
Ketones, ur: 20 mg/dL — AB
NITRITE: NEGATIVE
PH: 5 (ref 5.0–8.0)
Protein, ur: 30 mg/dL — AB
SPECIFIC GRAVITY, URINE: 1.034 — AB (ref 1.005–1.030)

## 2018-04-09 LAB — PREGNANCY, URINE: Preg Test, Ur: NEGATIVE

## 2018-04-09 MED ORDER — POLYETHYLENE GLYCOL 3350 17 G PO PACK: 17.0000 g | PACK | Freq: Every day | ORAL | 0 refills | Status: DC | PRN

## 2018-04-09 MED ORDER — IBUPROFEN 400 MG PO TABS
600.0000 mg | ORAL_TABLET | Freq: Once | ORAL | Status: AC
  Administered 2018-04-09: 600 mg via ORAL
  Filled 2018-04-09: qty 1

## 2018-04-09 MED ORDER — METHOCARBAMOL 500 MG PO TABS: 1000.0000 mg | ORAL_TABLET | Freq: Three times a day (TID) | ORAL | 0 refills | Status: DC | PRN

## 2018-04-09 MED ORDER — IBUPROFEN 600 MG PO TABS: 600.0000 mg | ORAL_TABLET | Freq: Four times a day (QID) | ORAL | 0 refills | Status: DC | PRN

## 2018-04-09 MED ORDER — CEPHALEXIN 500 MG PO CAPS: 500.0000 mg | ORAL_CAPSULE | Freq: Three times a day (TID) | ORAL | 0 refills | Status: DC

## 2018-04-09 MED ORDER — CEPHALEXIN 250 MG PO CAPS
500.0000 mg | ORAL_CAPSULE | Freq: Once | ORAL | Status: AC
  Administered 2018-04-09: 500 mg via ORAL
  Filled 2018-04-09: qty 2

## 2018-04-09 NOTE — ED Provider Notes (Signed)
[facility] EMERGENCY DEPARTMENT Provider Note   CSN: [id number] Arrival date & time: 04/09/18  0715     History   Chief Complaint Chief Complaint  Patient presents with  . Constipation  . Abdominal Pain    HPI Claudia Washington is a 37 y.o. female.  HPI Patient presents with roughly 1 week history of constipation.  Was seen at urgent care 2 days ago.  Advised to use enemas as needed.  States she is had little improvement of symptoms after enema use.  Patient continues to complain of left lower abdominal pain.  States she does have some pain to her left flank.  Pain comes in waves.  She denies any urinary frequency, dysuria or hematuria.  No vaginal bleeding or discharge.  Denies nausea or vomiting. Past Medical History:  Diagnosis Date  . Anemia   . Irregular menses   . Superficial thrombophlebitis     Patient Active Problem List   Diagnosis Date Noted  . Musculoskeletal back pain 03/22/2018  . Rotator cuff disorder, right 03/22/2018  . Excessive gas 03/22/2018  . Constipation 09/19/2017  . Gastroesophageal reflux disease without esophagitis 09/19/2017  . Acute pain of right shoulder 05/13/2017    Past Surgical History:  Procedure Laterality Date  . GANGLION CYST EXCISION    . TUBAL LIGATION  2008     OB History    Gravida  5   Para  5   Term  4   Preterm  1   AB      Living  5     SAB      TAB      Ectopic      Multiple      Live Births               Home Medications    Prior to Admission medications   Medication Sig Start Date End Date Taking? Authorizing Provider  cephALEXin (KEFLEX) 500 MG capsule Take 1 capsule (500 mg total) by mouth 3 (three) times daily. 04/09/18   Loren Racer, MD  ibuprofen (ADVIL,MOTRIN) 600 MG tablet Take 1 tablet (600 mg total) by mouth every 6 (six) hours as needed. 04/09/18   Loren Racer, MD  methocarbamol (ROBAXIN) 500 MG tablet Take 2 tablets (1,000 mg total) by mouth every 8 (eight)  hours as needed. 04/09/18   Loren Racer, MD  omeprazole (PRILOSEC) 40 MG capsule Take 1 capsule (40 mg total) by mouth daily. 03/22/18   Tillman Sers, DO  polyethylene glycol (MIRALAX / GLYCOLAX) packet Take 17 g by mouth daily as needed for moderate constipation or severe constipation. 04/09/18   Loren Racer, MD  simethicone (MYLICON) 80 MG chewable tablet Chew 1 tablet (80 mg total) by mouth every 6 (six) hours as needed for flatulence. Patient not taking: Reported on 04/07/2018 03/22/18   Tillman Sers, DO    Family History Family History  Problem Relation Age of Onset  . Hypertension Mother   . Diabetes Maternal Grandmother   . Stroke Maternal Grandmother   . Hypertension Maternal Grandmother   . Depression Paternal Grandmother   . Asthma Sister   . Asthma Brother     Social History Social History   Tobacco Use  . Smoking status: Never Smoker  . Smokeless tobacco: Never Used  Substance Use Topics  . Alcohol use: No  . Drug use: No     Allergies   Patient has no known allergies.   Review of  Systems Review of Systems  Constitutional: Negative for chills and fever.  Respiratory: Negative for cough and shortness of breath.   Cardiovascular: Negative for chest pain, palpitations and leg swelling.  Gastrointestinal: Positive for abdominal pain and constipation. Negative for diarrhea, nausea and vomiting.  Genitourinary: Negative for dysuria, flank pain, frequency, pelvic pain, vaginal bleeding and vaginal discharge.  Musculoskeletal: Positive for back pain and myalgias. Negative for neck pain and neck stiffness.  Skin: Negative for rash and wound.  Neurological: Negative for dizziness, weakness, numbness and headaches.  All other systems reviewed and are negative.    Physical Exam Updated Vital Signs BP 110/81 (BP Location: Right Arm)   Pulse 86   Temp 98.6 F (37 C) (Oral)   Resp 16   LMP 03/24/2018 (Exact Date)   SpO2 100%   Physical Exam    Constitutional: She is oriented to person, place, and time. She appears well-developed and well-nourished.  HENT:  Head: Normocephalic and atraumatic.  Mouth/Throat: Oropharynx is clear and moist.  Eyes: Pupils are equal, round, and reactive to light. EOM are normal.  Neck: Normal range of motion. Neck supple.  Cardiovascular: Normal rate and regular rhythm.  Pulmonary/Chest: Effort normal and breath sounds normal.  Abdominal: Soft. Bowel sounds are normal. There is no tenderness. There is no rebound and no guarding.  Abdominal exam is benign.  No tenderness, rebound or guarding.  Musculoskeletal: Normal range of motion. She exhibits tenderness. She exhibits no edema.  Patient has some tenderness over the left inguinal ligament.  Mild left lumbar paraspinal tenderness.  No definite CVA tenderness.  Neurological: She is alert and oriented to person, place, and time.  Skin: Skin is warm and dry. No rash noted. No erythema.  Psychiatric: She has a normal mood and affect. Her behavior is normal.  Nursing note and vitals reviewed.    ED Treatments / Results  Labs (all labs ordered are listed, but only abnormal results are displayed) Labs Reviewed  URINALYSIS, ROUTINE W REFLEX MICROSCOPIC - Abnormal; Notable for the following components:      Result Value   APPearance HAZY (*)    Specific Gravity, Urine 1.034 (*)    Ketones, ur 20 (*)    Protein, ur 30 (*)    Leukocytes, UA MODERATE (*)    All other components within normal limits  PREGNANCY, URINE    EKG None  Radiology No results found.  Procedures Procedures (including critical care time)  Medications Ordered in ED Medications  cephALEXin (KEFLEX) capsule 500 mg (has no administration in time range)  ibuprofen (ADVIL,MOTRIN) tablet 600 mg (has no administration in time range)     Initial Impression / Assessment and Plan / ED Course  I have reviewed the triage vital signs and the nursing notes.  Pertinent labs &  imaging results that were available during my care of the patient were reviewed by me and considered in my medical decision making (see chart for details).     Moderate leukocytes and white blood cells in urine.  Question possible early pyelonephritis given left flank tenderness.  Will start on antibiotics.  Abdominal exam is benign.  Will start on MiraLAX as needed for constipation.  Very strict return precautions have been given.  Final Clinical Impressions(s) / ED Diagnoses   Final diagnoses:  Acute left flank pain  Urinary tract infection with hematuria, site unspecified  Constipation, unspecified constipation type    ED Discharge Orders         Ordered  cephALEXin (KEFLEX) 500 MG capsule  3 times daily     04/09/18 1001    ibuprofen (ADVIL,MOTRIN) 600 MG tablet  Every 6 hours PRN     04/09/18 1001    methocarbamol (ROBAXIN) 500 MG tablet  Every 8 hours PRN     04/09/18 1001    polyethylene glycol (MIRALAX / GLYCOLAX) packet  Daily PRN     04/09/18 1001           Loren Racer, MD 04/09/18 1003

## 2018-04-09 NOTE — ED Triage Notes (Signed)
Pt. Stated, Claudia Washington not had a normal boop in a week, I went to UC and told to get an enema , I did and it all has just a little bit and still my stomach hurts and I need to go.

## 2018-04-12 ENCOUNTER — Ambulatory Visit: Payer: Medicaid Other | Admitting: Family Medicine

## 2018-04-17 NOTE — Progress Notes (Deleted)
  Subjective:   Patient ID: Claudia Washington    DOB: [birth year], 37 y.o. female   MRN: [id number]  Claudia Washington is a 37 y.o. female with a history of *** here for ***  Shoulder and back pain - Seen 9/11 for same, shoulder pain thought to be due to rotator cuff tendinopathy as visualized on MRI 2018. Advised PT and conservative measures with tylenol, ibuprofen, heating pad, and muscle rubs.  Review of Systems:  Per HPI.  PMFSH, medications and smoking status reviewed.  Objective:   LMP 03/24/2018 (Exact Date)  Vitals and nursing note reviewed.  General: well nourished, well developed, in no acute distress with non-toxic appearance HEENT: normocephalic, atraumatic, moist mucous membranes Neck: supple, non-tender without lymphadenopathy CV: regular rate and rhythm without murmurs, rubs, or gallops, no lower extremity edema Lungs: clear to auscultation bilaterally with normal work of breathing Abdomen: soft, non-tender, non-distended, no masses or organomegaly palpable, normoactive bowel sounds Skin: warm, dry, no rashes or lesions Extremities: warm and well perfused, normal tone MSK: ROM grossly intact, strength intact, gait normal Neuro: Alert and oriented, speech normal  Assessment & Plan:   No problem-specific Assessment & Plan notes found for this encounter.  No orders of the defined types were placed in this encounter.  No orders of the defined types were placed in this encounter.   Ellwood Dense, DO PGY-2, [facility] 04/17/2018 10:11 AM

## 2018-04-19 ENCOUNTER — Ambulatory Visit: Payer: Medicaid Other | Admitting: Family Medicine

## 2018-05-23 NOTE — Progress Notes (Signed)
  Subjective:   Patient ID: Claudia Washington    DOB: January 03, 1981, 37 y.o. female   MRN: [id number]  Claudia Washington is a 37 y.o. female with a history of GERD, constipation here for   R Shoulder Pain - previously seen by Dr. Wonda Oldsiccio for rotator cuff d/o, discussed conservative measures and strengthening exercises. Referral made to PT.  - Reports pain is somewhat improved although has occasional pain in right shoulder when putting pressure on right elbow. She has not tried any of the strengthening exercises recommended.  She also did not make an appointment to see PT.  Constipation Today for follow-up of constipation.  Reports that she went to the ED in September for bilateral lower quadrant pain that occurred mostly at night.  Abdominal x-ray in the ED unrevealing for large stool burden, although had labs consistent with UTI and was started on antibiotics.  She has since completed antibiotic course and denies dysuria, frequency, or lower quadrant pain.  Reported to have had an enema with subsequent bowel movement.  Has been drinking prune juice occasionally.  Received MiraLAX prescription from ED.  Ports she had a bowel movement yesterday that was soft, #3 on [place] stool chart.  She is currently having a bowel movement every other day and reports having at least one vegetable with each meal.  Review of Systems:  Per HPI.  PMFSH, medications and smoking status reviewed.  Objective:   BP 110/70   Pulse 73   Temp 97.9 F (36.6 C) (Oral)   Wt 133 lb (60.3 kg)   LMP 05/15/2018   SpO2 100%   BMI 22.83 kg/m  Vitals and nursing note reviewed.  General: well nourished, well developed, in no acute distress with non-toxic appearance HEENT: normocephalic, atraumatic, moist mucous membranes CV: regular rate and rhythm without murmurs, rubs, or gallops Lungs: clear to auscultation bilaterally with normal work of breathing Abdomen: soft, non-tender, non-distended, no masses or organomegaly palpable,  normoactive bowel sounds Skin: warm, dry, no rashes or lesions Extremities: warm and well perfused, normal tone MSK: ROM grossly intact, strength intact, gait normal Neuro: Alert and oriented, speech normal  Assessment & Plan:   Constipation Improvement in abdominal pain noted with enema at urgent care with subsequent bowel movement, also received MiraLAX prescription that she is using occasionally as well as prune juice with improvement in bowel movements.  Encouraged continued increase in fiber intake with at least 5 servings of fruits or vegetables per day.  Advised using MiraLAX daily to achieve soft stools when constipated.  Benign abdominal exam today.  Return precautions reviewed and handout provided.  Rotator cuff disorder, right Some improvement noted however has not used strengthening exercises nor did she make her physical therapy appointment.  Advised she do this and follow-up if conservative measures are not helpful.  No orders of the defined types were placed in this encounter.  No orders of the defined types were placed in this encounter.  Ellwood DenseAlison Kayliah Tindol, DO PGY-2, [facility] 05/24/2018 10:14 AM

## 2018-05-24 ENCOUNTER — Ambulatory Visit (INDEPENDENT_AMBULATORY_CARE_PROVIDER_SITE_OTHER): Payer: Medicaid Other | Admitting: Family Medicine

## 2018-05-24 ENCOUNTER — Encounter: Payer: Self-pay | Admitting: Family Medicine

## 2018-05-24 VITALS — BP 110/70 | HR 73 | Temp 97.9°F | Wt 133.0 lb

## 2018-05-24 DIAGNOSIS — K5909 Other constipation: Secondary | ICD-10-CM

## 2018-05-24 DIAGNOSIS — M67911 Unspecified disorder of synovium and tendon, right shoulder: Secondary | ICD-10-CM

## 2018-05-24 NOTE — Assessment & Plan Note (Signed)
Some improvement noted however has not used strengthening exercises nor did she make her physical therapy appointment.  Advised she do this and follow-up if conservative measures are not helpful.

## 2018-05-24 NOTE — Addendum Note (Signed)
Addended by: Caro LarocheUMBALL, ALISON M on: 05/24/2018 11:28 AM   Modules accepted: Orders

## 2018-05-24 NOTE — Patient Instructions (Signed)
It was great to see you!  Our plans for today:  - take one packet or capful of Miralax every day when you are feeling constipated, take until your stools look like #4 (see below). - [facility] for your shoulder - [phone]. - Increase fruit and vegetable consumption to have regular bowel movements and healthy lifestyle.  Take care and seek immediate care sooner if you develop any concerns.   Dr. Mollie Germanyumball [facility]  Here is an example of what a healthy plate looks like:    ? Make half your plate fruits and vegetables.     ? Focus on whole fruits.     ? Vary your veggies.  ? Make half your grains whole grains. -     ? Look for the word "whole" at the beginning of the ingredients list    ? Some whole-grain ingredients include whole oats, whole-wheat flour,        whole-grain corn, whole-grain brown rice, and whole rye.  ? Move to low-fat and fat-free milk or yogurt.  ? Vary your protein routine. - Meat, fish, poultry (chicken, [place]urkey), eggs, beans (kidney, pinto), dairy.  ? Drink and eat less sodium, saturated fat, and added sugars.

## 2018-05-24 NOTE — Assessment & Plan Note (Signed)
Improvement in abdominal pain noted with enema at urgent care with subsequent bowel movement, also received MiraLAX prescription that she is using occasionally as well as prune juice with improvement in bowel movements.  Encouraged continued increase in fiber intake with at least 5 servings of fruits or vegetables per day.  Advised using MiraLAX daily to achieve soft stools when constipated.  Benign abdominal exam today.  Return precautions reviewed and handout provided.

## 2018-08-07 ENCOUNTER — Telehealth: Payer: Self-pay | Admitting: Family Medicine

## 2018-08-07 NOTE — Telephone Encounter (Signed)
Pt called and would like to see an ENT doctor for her jaw. She is having problems opening her mouth. jw

## 2018-08-07 NOTE — Telephone Encounter (Signed)
If patient is having trouble opening her jaw she should come in to be seen so she can be evaluated by a physician. I recommend being seen in [facility] as PCP is out of town.   Orpah Clinton, PGY-2 [facility] 08/07/2018 2:44 PM

## 2018-08-07 NOTE — Telephone Encounter (Signed)
LM for patient asking her to call back and schedule an appointment.  If/when patient call back please assist her in making an appointment to discuss jaw issues.  Jazmin Hartsell,CMA

## 2018-08-07 NOTE — Telephone Encounter (Signed)
Will forward to MD to advise. Jazmin Hartsell,CMA  

## 2018-09-13 ENCOUNTER — Telehealth (HOSPITAL_COMMUNITY): Payer: Self-pay | Admitting: Family Medicine

## 2018-09-13 ENCOUNTER — Encounter (HOSPITAL_COMMUNITY): Payer: Self-pay | Admitting: Emergency Medicine

## 2018-09-13 ENCOUNTER — Ambulatory Visit (HOSPITAL_COMMUNITY)
Admission: EM | Admit: 2018-09-13 | Discharge: 2018-09-13 | Disposition: A | Discharge status: Home / Self Care | Payer: Medicaid Other | Attending: Family Medicine | Admitting: Family Medicine

## 2018-09-13 DIAGNOSIS — M7989 Other specified soft tissue disorders: Secondary | ICD-10-CM

## 2018-09-13 NOTE — ED Triage Notes (Signed)
Pt states shes had R lower leg calf swelling x1 week. States shes also had bruising on the L calf.

## 2018-09-13 NOTE — ED Provider Notes (Addendum)
[facility]    CSN: [id number] Arrival date & time: 09/13/18  4008     History   Chief Complaint Chief Complaint  Patient presents with  . Leg Swelling    HPI Claudia Washington is a 38 y.o. female.   Pt is a 38 year old female that presents with RLE swelling and tenderness over the past week. Symptoms have been constant. She has been having a lot of muscle cramping and spasms in the leg. No bruising, injuries. She is concerned for DVT. No hx of same. No recent traveling. No oral contraceptive use. No fever, chills. She has not taken anything for the symptoms. Denies any chest pain, SOB or palpitations.  She does have hx of thrombophlebitis. She does not smoke.   ROS per HPI      Past Medical History:  Diagnosis Date  . Anemia   . Irregular menses   . Superficial thrombophlebitis     Patient Active Problem List   Diagnosis Date Noted  . Musculoskeletal back pain 03/22/2018  . Rotator cuff disorder, right 03/22/2018  . Excessive gas 03/22/2018  . Constipation 09/19/2017  . Gastroesophageal reflux disease without esophagitis 09/19/2017  . Acute pain of right shoulder 05/13/2017    Past Surgical History:  Procedure Laterality Date  . GANGLION CYST EXCISION    . TUBAL LIGATION  2008    OB History    Gravida  5   Para  5   Term  4   Preterm  1   AB      Living  5     SAB      TAB      Ectopic      Multiple      Live Births               Home Medications    Prior to Admission medications   Not on File    Family History Family History  Problem Relation Age of Onset  . Hypertension Mother   . Diabetes Maternal Grandmother   . Stroke Maternal Grandmother   . Hypertension Maternal Grandmother   . Depression Paternal Grandmother   . Asthma Sister   . Asthma Brother     Social History Social History   Tobacco Use  . Smoking status: Never Smoker  . Smokeless tobacco: Never Used  Substance Use Topics  . Alcohol use: No  .  Drug use: No     Allergies   Patient has no known allergies.   Review of Systems Review of Systems  Constitutional: Negative for activity change, chills, fatigue and fever.  Respiratory: Negative for cough, chest tightness, shortness of breath, wheezing and stridor.   Cardiovascular: Positive for leg swelling. Negative for chest pain and palpitations.  Musculoskeletal: Positive for myalgias.  Skin: Negative for color change, pallor, rash and wound.  Hematological: Negative for adenopathy. Does not bruise/bleed easily.     Physical Exam Triage Vital Signs ED Triage Vitals  Enc Vitals Group     BP 09/13/18 1039 116/71     Pulse Rate 09/13/18 1039 90     Resp 09/13/18 1039 16     Temp 09/13/18 1039 98.5 F (36.9 C)     Temp src --      SpO2 09/13/18 1039 100 %     Weight --      Height --      Head Circumference --      Peak Flow --  Pain Score 09/13/18 1037 0     Pain Loc --      Pain Edu? --      Excl. in GC? --    No data found.  Updated Vital Signs BP 116/71   Pulse 90   Temp 98.5 F (36.9 C)   Resp 16   LMP 08/15/2018   SpO2 100%   Visual Acuity Right Eye Distance:   Left Eye Distance:   Bilateral Distance:    Right Eye Near:   Left Eye Near:    Bilateral Near:     Physical Exam Vitals signs and nursing note reviewed.  Constitutional:      General: She is not in acute distress.    Appearance: Normal appearance. She is not ill-appearing, toxic-appearing or diaphoretic.  HENT:     Head: Normocephalic and atraumatic.     Nose: Nose normal.  Eyes:     Conjunctiva/sclera: Conjunctivae normal.  Neck:     Musculoskeletal: Normal range of motion.  Pulmonary:     Effort: Pulmonary effort is normal.  Musculoskeletal:        General: Swelling present.     Comments: Good ROM to the RLE and right leg. There is mild swelling and tenderness to the calf. No increased warmth or erythema. No bruising.  Sensation and distal pulse intact.   Skin:     General: Skin is warm and dry.  Neurological:     Mental Status: She is alert.  Psychiatric:        Mood and Affect: Mood normal.      UC Treatments / Results  Labs (all labs ordered are listed, but only abnormal results are displayed) Labs Reviewed - No data to display  EKG None  Radiology No results found.  Procedures Procedures (including critical care time)  Medications Ordered in UC Medications - No data to display  Initial Impression / Assessment and Plan / UC Course  I have reviewed the triage vital signs and the nursing notes.  Pertinent labs & imaging results that were available during my care of the patient were reviewed by me and considered in my medical decision making (see chart for details).     Sending pt for outpatient [place] to r/o DVT in the RLE.  This most likely dx is muscle spasm or strain.  Instructed to take NSAIDs and go in the morning for scheduled [place].  Pt understanding and agrees.  Final Clinical Impressions(s) / UC Diagnoses   Final diagnoses:  Leg swelling     Discharge Instructions     We will send you for an [place] to rule out a blood clot.  This could be a less severe problem like muscle spasm or cramping due to vitamin deficiency or not drinking enough water Make sure that you are drinking plenty of water and increase you magnesium and potassium Ibuprofen for pain and inflammation.   Tomorrow you will go to your appointment at 8:45 at [place].     ED Prescriptions    None     Controlled Substance Prescriptions [place] Controlled Substance Registry consulted? no        Janace Aris, NP 09/14/18 [phone]

## 2018-09-13 NOTE — Telephone Encounter (Signed)
Changing order from routine to stat

## 2018-09-13 NOTE — Discharge Instructions (Addendum)
We will send you for an [place] to rule out a blood clot.  This could be a less severe problem like muscle spasm or cramping due to vitamin deficiency or not drinking enough water Make sure that you are drinking plenty of water and increase you magnesium and potassium Ibuprofen for pain and inflammation.   Tomorrow you will go to your appointment at 8:45 at [place].

## 2018-09-14 ENCOUNTER — Ambulatory Visit (HOSPITAL_COMMUNITY)
Admission: RE | Admit: 2018-09-14 | Discharge: 2018-09-14 | Disposition: A | Discharge status: Home / Self Care | Payer: Medicaid Other | Source: Ambulatory Visit | Attending: Internal Medicine | Admitting: Internal Medicine

## 2018-09-14 DIAGNOSIS — M79606 Pain in leg, unspecified: Secondary | ICD-10-CM | POA: Diagnosis present

## 2018-09-14 DIAGNOSIS — M7989 Other specified soft tissue disorders: Secondary | ICD-10-CM

## 2018-09-14 NOTE — Progress Notes (Signed)
Right lower extremity venous duplex completed. Preliminary results in Chart review CV Proc. [place] Gayatri Teasdale,RVS 09/14/2018, 9:56 AM

## 2018-09-18 NOTE — Progress Notes (Deleted)
  Subjective:   Patient ID: Claudia Washington    DOB: 03-May-1981, 38 y.o. female   MRN: [id number]  Claudia Washington is a 38 y.o. female with a history of GERD, rotator cuff d/o here for   BACK PAIN - seen for back pain 03/22/18, thought to be MSK in origin. Referred to PT with conservative tx.  Back pain began *** days ago. Pain is described as ***. Patient has tried ***. Pain radiates ***. History of trauma or injury: *** Patient believes might be causing their pain: ***  Prior history of similar pain: *** History of cancer: *** Weak immune system:  *** History of IV drug use: *** History of steroid use: ***  Symptoms Incontinence of bowel or bladder:  *** Numbness of leg: *** Fever: *** Rest or Night pain: *** Weight Loss:  **** Rash: ***  ***due for pap  Review of Systems:  Per HPI.  PMFSH, medications and smoking status reviewed.  Objective:   There were no vitals taken for this visit. Vitals and nursing note reviewed.  General: well nourished, well developed, in no acute distress with non-toxic appearance HEENT: normocephalic, atraumatic, moist mucous membranes Neck: supple, non-tender without lymphadenopathy CV: regular rate and rhythm without murmurs, rubs, or gallops, no lower extremity edema Lungs: clear to auscultation bilaterally with normal work of breathing Abdomen: soft, non-tender, non-distended, no masses or organomegaly palpable, normoactive bowel sounds Skin: warm, dry, no rashes or lesions Extremities: warm and well perfused, normal tone MSK: ROM grossly intact, strength intact, gait normal Neuro: Alert and oriented, speech normal  Assessment & Plan:   No problem-specific Assessment & Plan notes found for this encounter.  No orders of the defined types were placed in this encounter.  No orders of the defined types were placed in this encounter.   Ellwood Dense, DO PGY-2, [facility] 09/18/2018 6:51 PM

## 2018-09-19 ENCOUNTER — Ambulatory Visit: Payer: Medicaid Other | Admitting: Family Medicine

## 2019-02-27 IMAGING — DX DG CHEST 2V
2 series · 2 of 2 positions shown · non-contrast
Comparison: None.

CLINICAL DATA: Patient complains of 1 week of intermittent CP x 1
week. Seen at primary [facility] and diagnosed with GERD. States that the
pain resolves when she holds her chest tight,

EXAM:
CHEST - 2 VIEW

[chest pa]
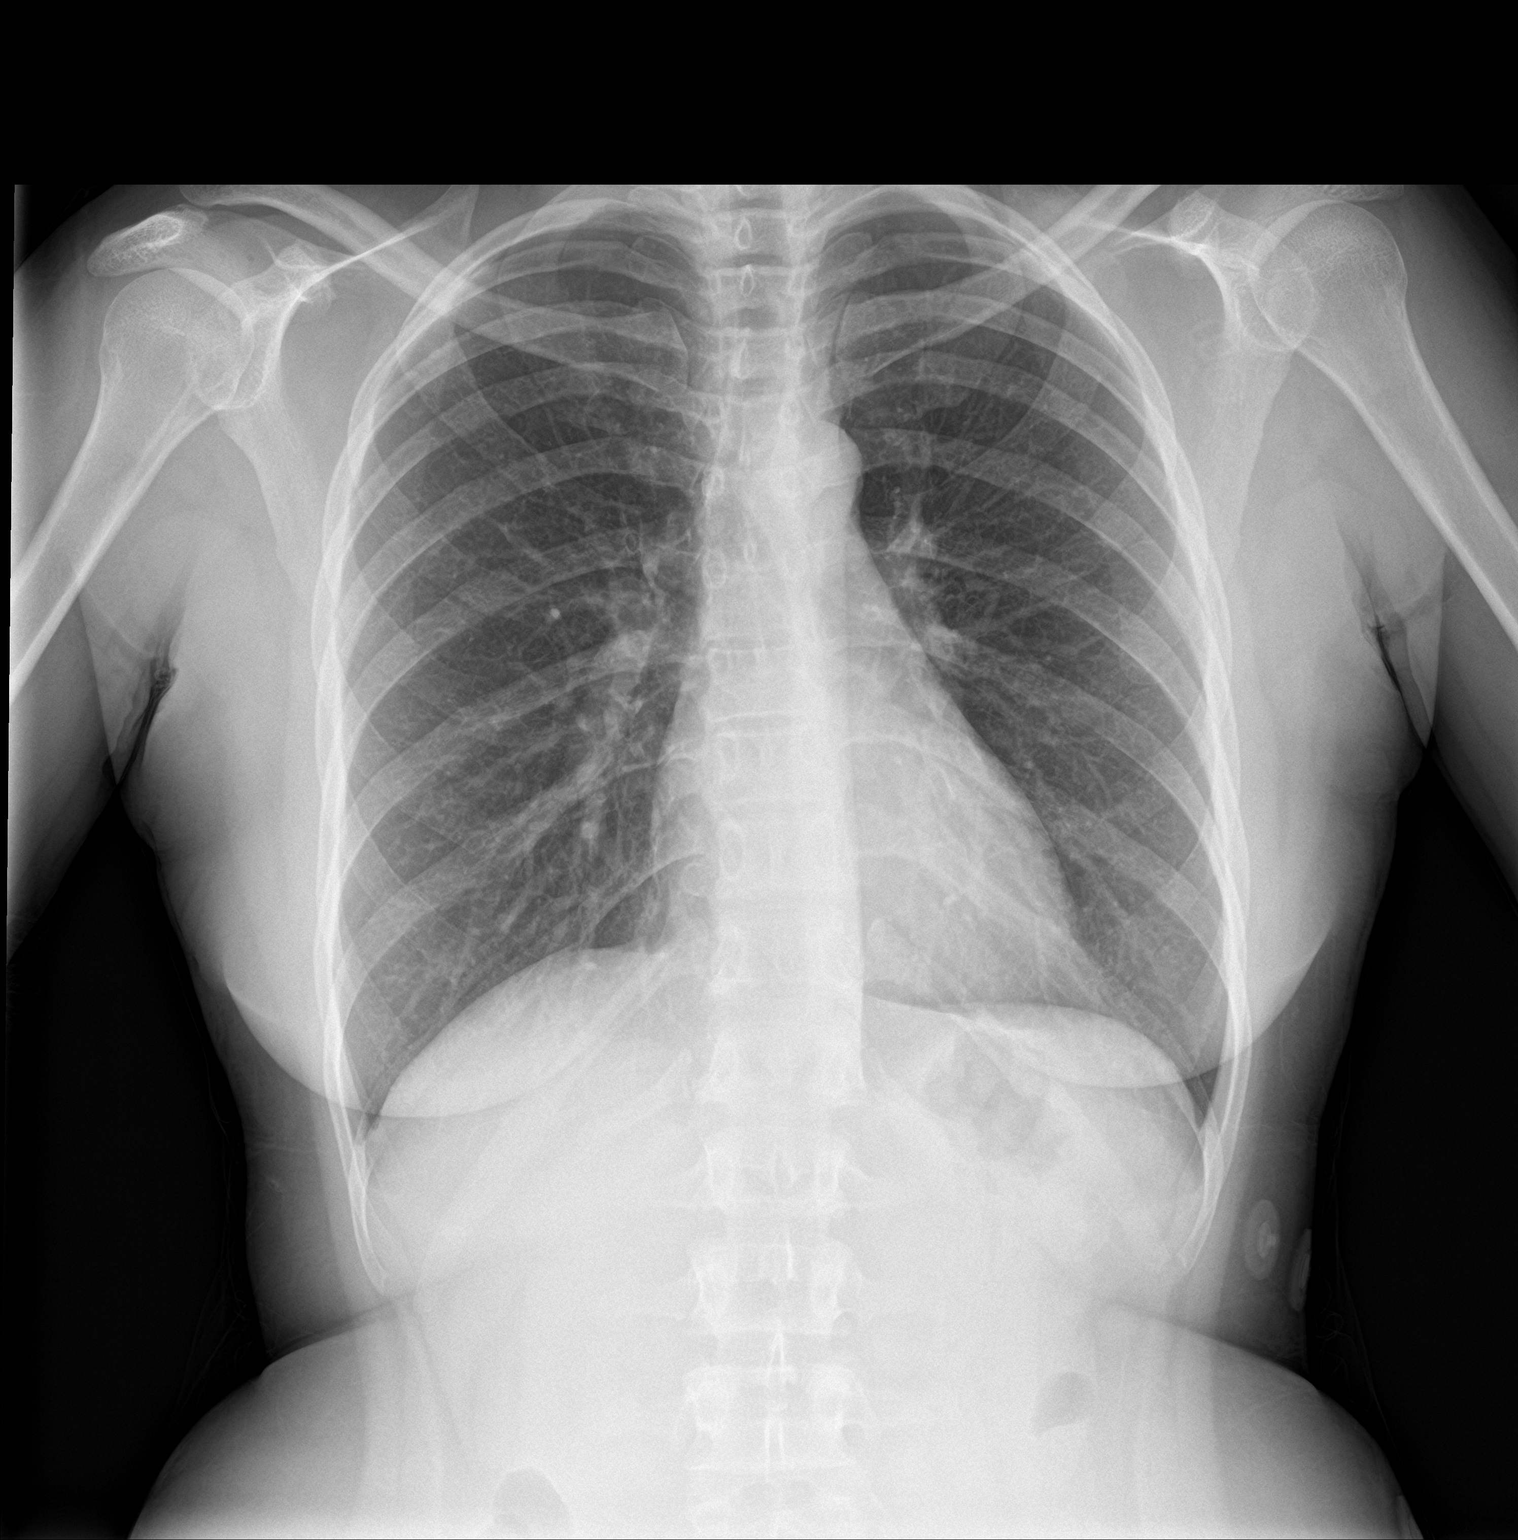

[chest lat]
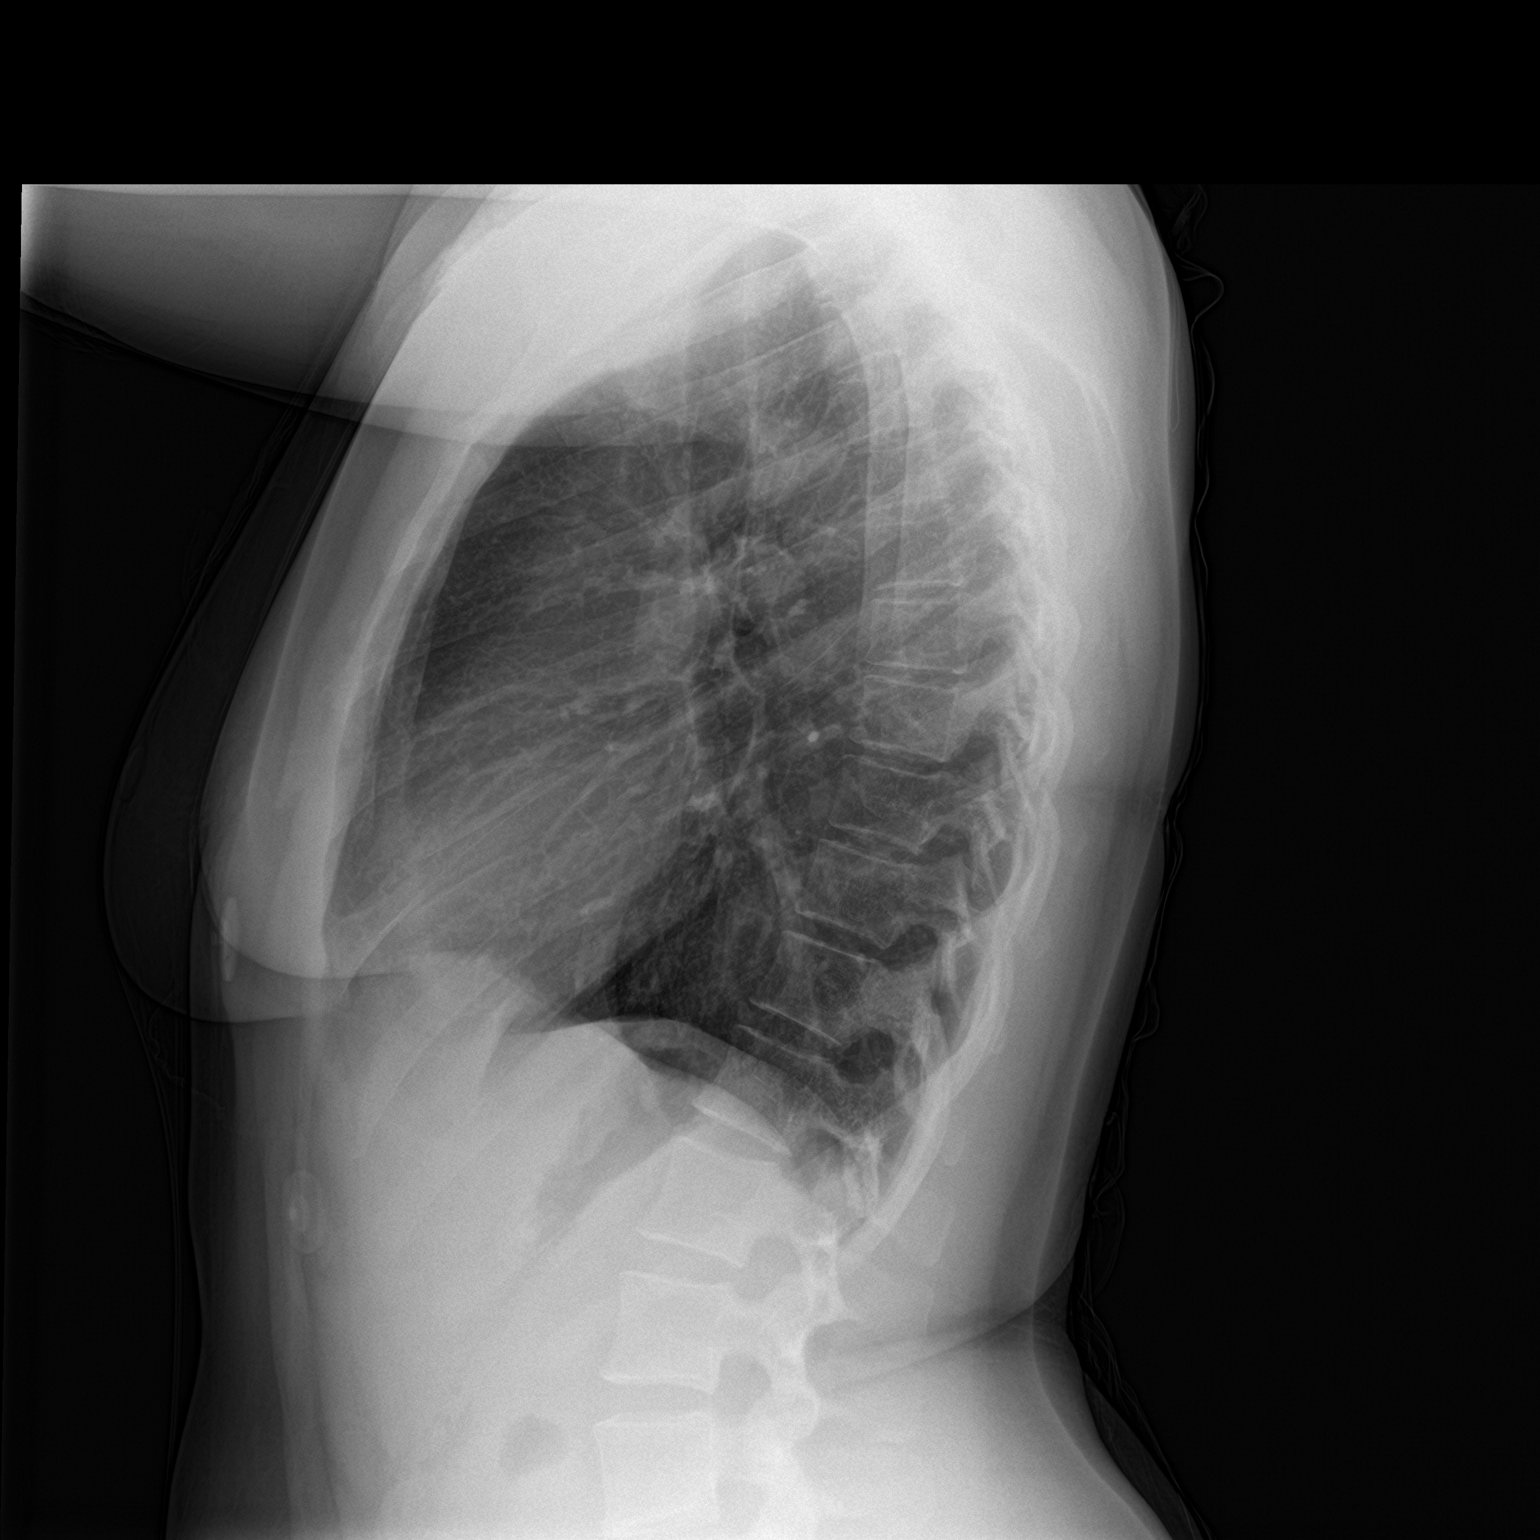

[2 of 2 positions shown; findings below may reference images not displayed]

FINDINGS: Normal mediastinum and cardiac silhouette. Normal pulmonary
vasculature. No evidence of effusion, infiltrate, or pneumothorax.
No acute bony abnormality.
IMPRESSION: Normal chest radiograph

## 2019-07-02 ENCOUNTER — Encounter (HOSPITAL_COMMUNITY): Payer: Self-pay

## 2019-07-02 ENCOUNTER — Other Ambulatory Visit: Payer: Self-pay

## 2019-07-02 ENCOUNTER — Ambulatory Visit (INDEPENDENT_AMBULATORY_CARE_PROVIDER_SITE_OTHER): Payer: Medicaid Other

## 2019-07-02 ENCOUNTER — Ambulatory Visit (HOSPITAL_COMMUNITY)
Admission: EM | Admit: 2019-07-02 | Discharge: 2019-07-02 | Disposition: A | Discharge status: Home / Self Care | Payer: Medicaid Other | Attending: Emergency Medicine | Admitting: Emergency Medicine

## 2019-07-02 DIAGNOSIS — S6990XA Unspecified injury of unspecified wrist, hand and finger(s), initial encounter: Secondary | ICD-10-CM

## 2019-07-02 DIAGNOSIS — S6991XA Unspecified injury of right wrist, hand and finger(s), initial encounter: Secondary | ICD-10-CM | POA: Diagnosis not present

## 2019-07-02 MED ORDER — IBUPROFEN 800 MG PO TABS: 800.0000 mg | ORAL_TABLET | Freq: Three times a day (TID) | ORAL | 0 refills | Status: DC
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
## 2019-07-02 NOTE — Discharge Instructions (Signed)
Xrays normal If you are not regaining movement of her any improvement in the next 2 to 3 days please follow-up with hand-contact info below Use anti-inflammatories for pain/swelling. You may take up to 800 mg Ibuprofen every 8 hours with food. You may supplement Ibuprofen with Tylenol [phone] mg every 8 hours.
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
## 2019-09-07 ENCOUNTER — Encounter: Payer: Self-pay | Admitting: Family Medicine

## 2019-09-07 ENCOUNTER — Ambulatory Visit (INDEPENDENT_AMBULATORY_CARE_PROVIDER_SITE_OTHER): Payer: Medicaid Other | Admitting: Family Medicine

## 2019-09-07 ENCOUNTER — Other Ambulatory Visit: Payer: Self-pay

## 2019-09-07 DIAGNOSIS — S6991XA Unspecified injury of right wrist, hand and finger(s), initial encounter: Secondary | ICD-10-CM | POA: Insufficient documentation

## 2019-09-07 DIAGNOSIS — S6991XD Unspecified injury of right wrist, hand and finger(s), subsequent encounter: Secondary | ICD-10-CM | POA: Diagnosis present

## 2019-09-07 MED ORDER — MELOXICAM 15 MG PO TABS: 15.0000 mg | ORAL_TABLET | Freq: Every day | ORAL | 0 refills | Status: DC

## 2019-09-07 NOTE — Assessment & Plan Note (Signed)
Unclear diagnosis.  For an injury that is 2 months old, her MTP remains exquisitely tender to palpation.  In most circumstances, any acute inflammation would have subsided.  Hand x-rays showed no evidence of bony abnormalities.  I suspect soft tissue pathology.  Based on the mechanism of injury, it seems that a Flexeril injury would be more probable although this appears to be an extensor issue.  We are not able to access her recent MRI.  Ultimately, Claudia Washington was encouraged to persistently call her sports medicine clinic until she can get an early appointment.  Her sports medicine doctor will have access to the MRI which will dictate next steps in management. -Mobic 15 mg daily -Thumb spica splint provided

## 2019-09-07 NOTE — Patient Instructions (Signed)
I'm so sorry that I couldn't access the information to tell you the next steps for your thumb.  My advice is to continue to badger your sports medicine doctor until you are able to get an appointment.  I have sent in a prescription for mobic the once-a-day medication.  Don't take this at the same time as motrin.

## 2019-09-10 IMAGING — DX DG ABDOMEN 2V
2 series · 2 of 2 positions shown · non-contrast
Comparison: None.

CLINICAL DATA: Severe constipation.  Rule out bowel obstruction

EXAM:
ABDOMEN - 2 VIEW

[abdomen erect]
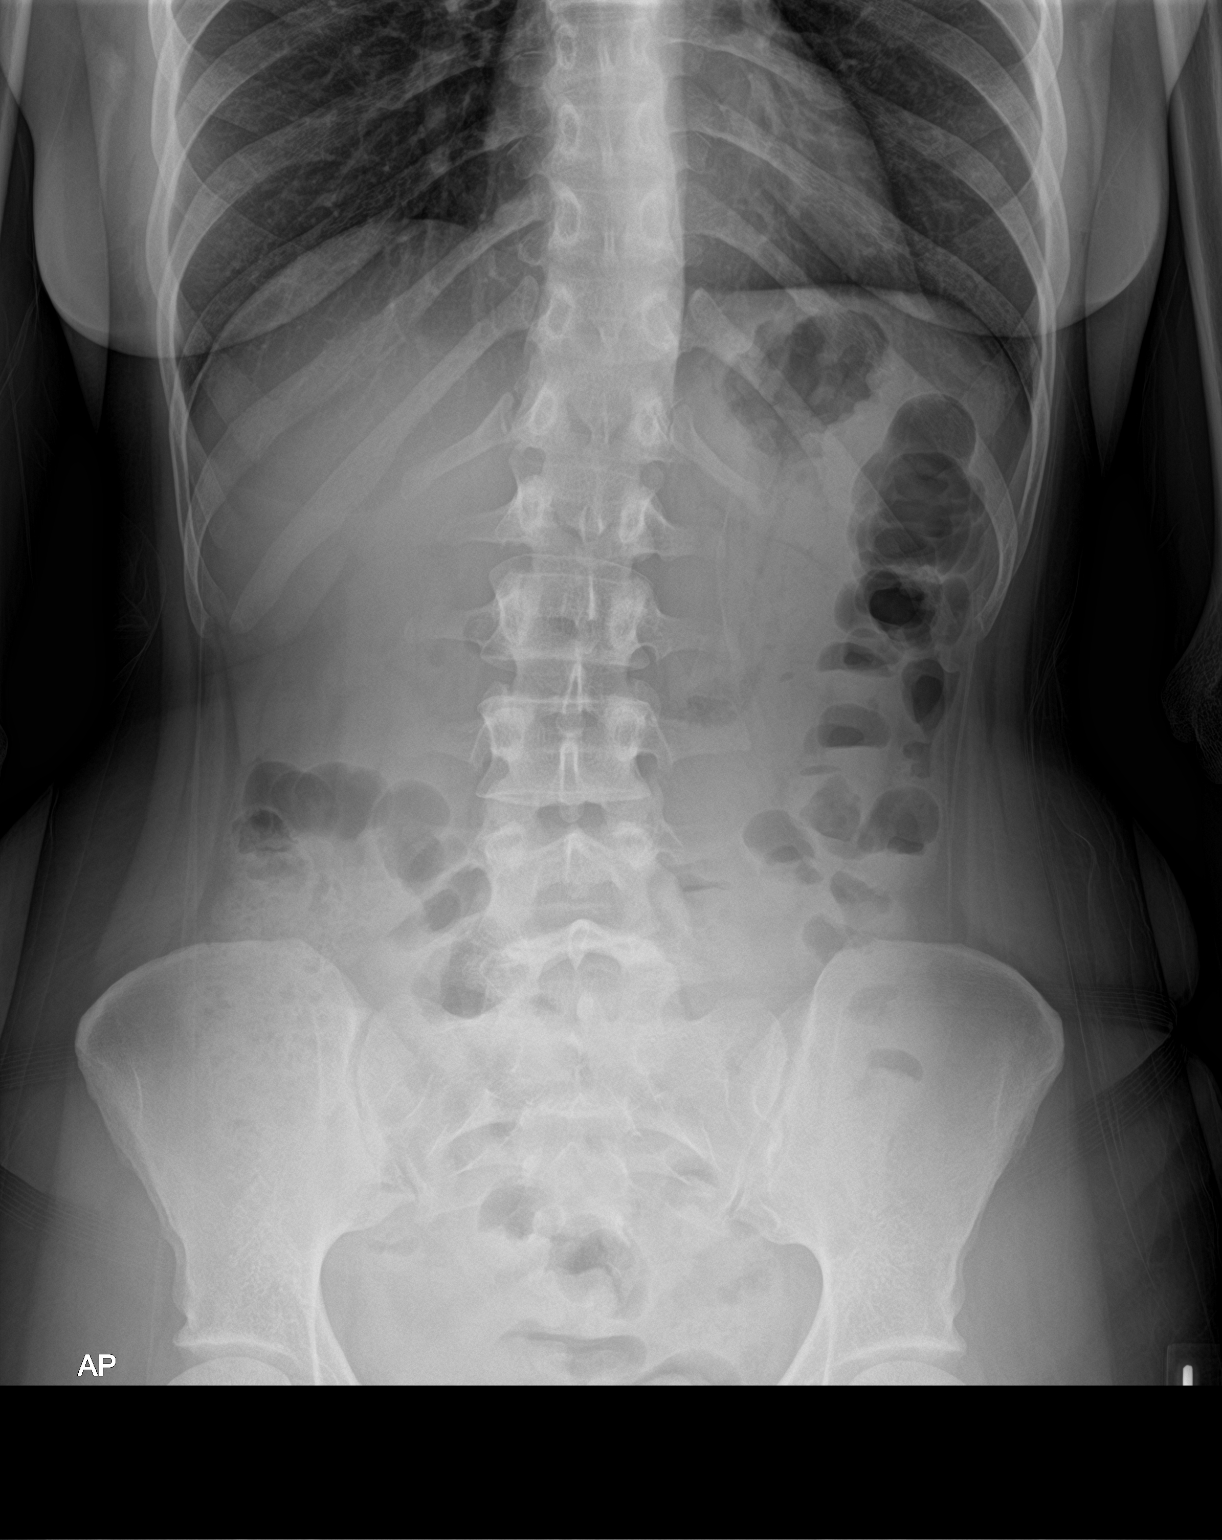

[abdomen supine]
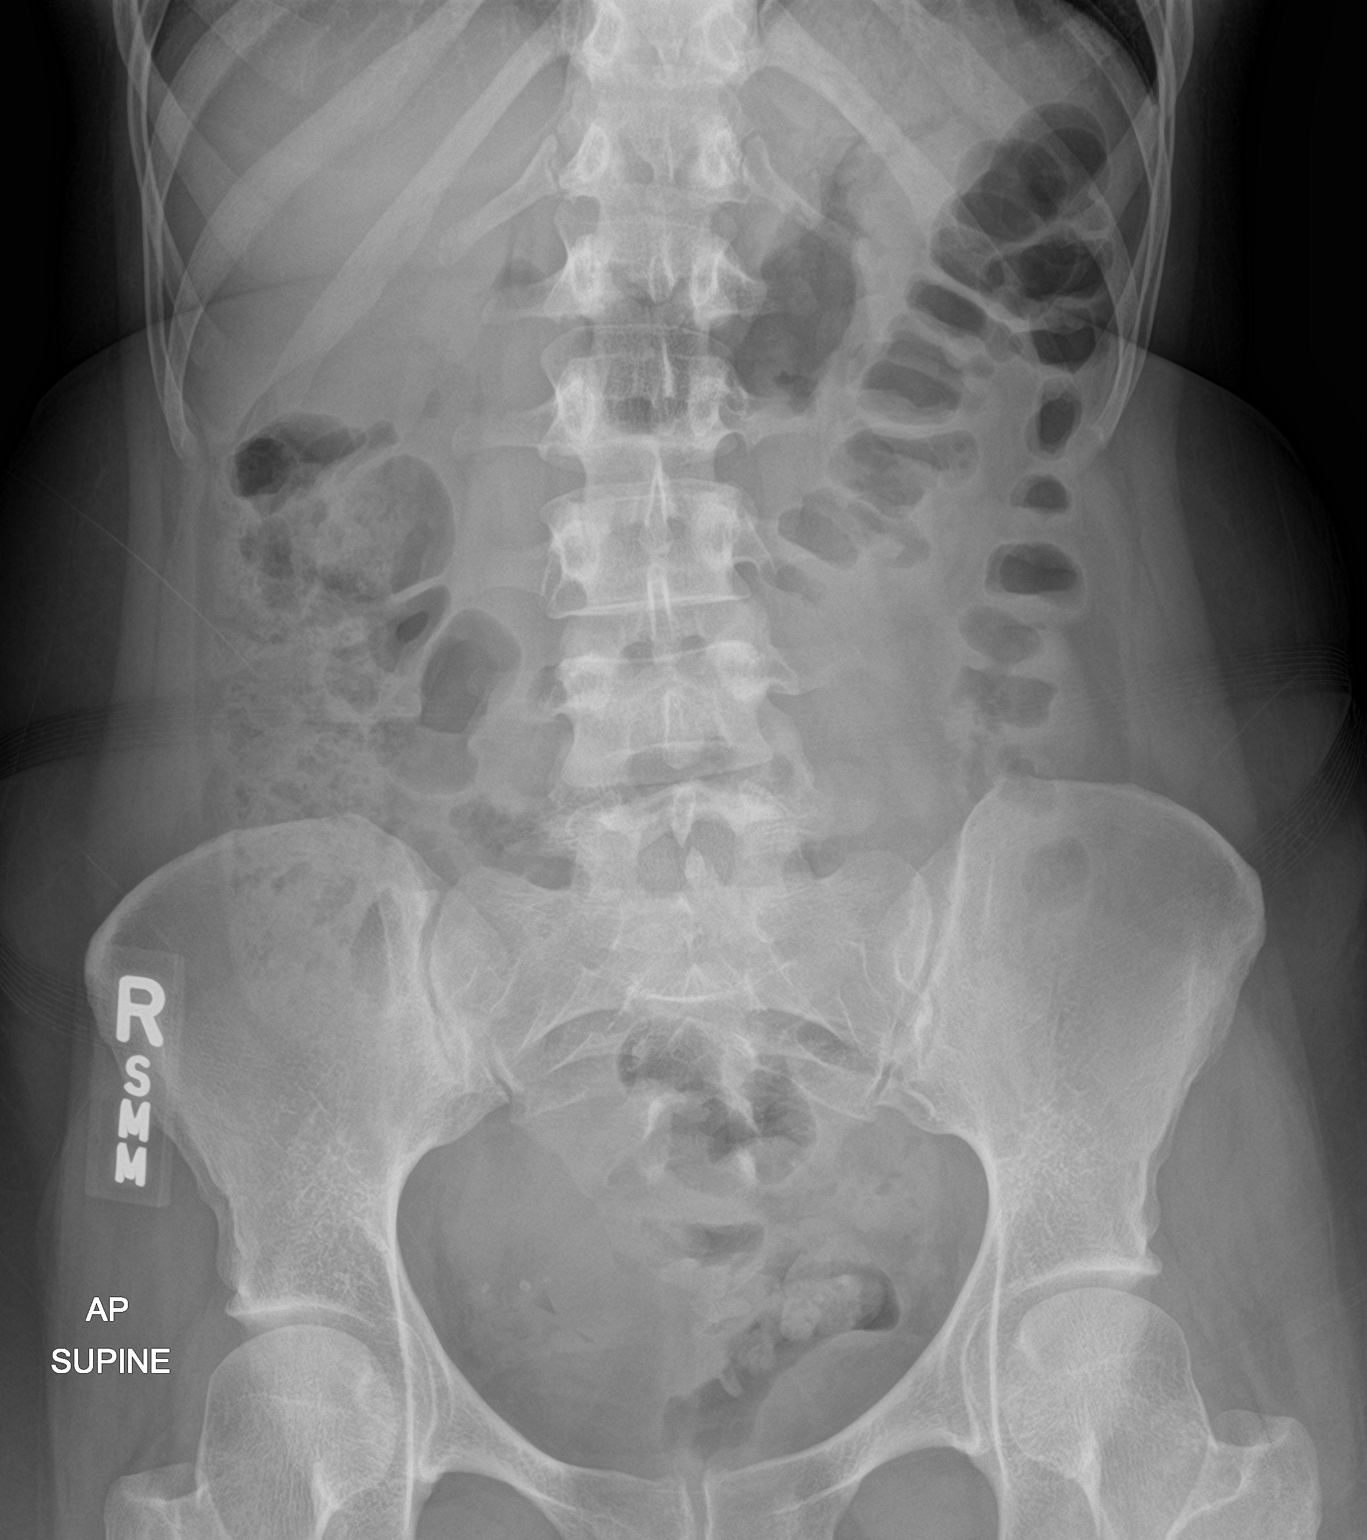

[2 of 2 positions shown; findings below may reference images not displayed]

FINDINGS: No significant increase in stool burden within the colon.
Nonobstructive bowel gas pattern. No free air organomegaly. No
suspicious calcification. Visualized lung bases clear. No bony
abnormality.
IMPRESSION: Negative.

## 2020-01-17 ENCOUNTER — Telehealth: Payer: Self-pay | Admitting: Family Medicine

## 2020-01-17 NOTE — Telephone Encounter (Signed)
LVM for pt to call office to tell her that she should schedule an appointment with the doctor to discuss this.  Did not see anything about this on her problem list. Also if a referral is needed we would need documentation to support a referral.Noela Brothers Joycelyn Man Rumple, CMA

## 2020-01-17 NOTE — Telephone Encounter (Signed)
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

## 2020-01-18 NOTE — Telephone Encounter (Signed)
Spoke with pt. She informed me that since Medicaid has changed. She now has to find a new PCP and will no longer be able to be seen here. She stated that she will let us know who she finds as a new PCP. To just disregard the referral for her jaw. Aquilla Solian, CMA

## 2022-09-23 ENCOUNTER — Other Ambulatory Visit (HOSPITAL_COMMUNITY)
Admission: RE | Admit: 2022-09-23 | Discharge: 2022-09-23 | Disposition: A | Discharge status: Home / Self Care | Payer: Commercial Managed Care - PPO | Source: Ambulatory Visit | Attending: Family Medicine | Admitting: Family Medicine

## 2022-09-23 ENCOUNTER — Ambulatory Visit (INDEPENDENT_AMBULATORY_CARE_PROVIDER_SITE_OTHER): Payer: Commercial Managed Care - PPO | Admitting: Student

## 2022-09-23 ENCOUNTER — Other Ambulatory Visit (HOSPITAL_COMMUNITY): Payer: Self-pay

## 2022-09-23 ENCOUNTER — Encounter: Payer: Self-pay | Admitting: Student

## 2022-09-23 VITALS — BP 104/60 | HR 72 | Ht 64.0 in | Wt 126.6 lb

## 2022-09-23 DIAGNOSIS — M542 Cervicalgia: Secondary | ICD-10-CM | POA: Diagnosis not present

## 2022-09-23 DIAGNOSIS — Z124 Encounter for screening for malignant neoplasm of cervix: Secondary | ICD-10-CM | POA: Diagnosis not present

## 2022-09-23 DIAGNOSIS — M67911 Unspecified disorder of synovium and tendon, right shoulder: Secondary | ICD-10-CM | POA: Diagnosis not present

## 2022-09-23 DIAGNOSIS — M25511 Pain in right shoulder: Secondary | ICD-10-CM

## 2022-09-23 DIAGNOSIS — G8929 Other chronic pain: Secondary | ICD-10-CM | POA: Diagnosis not present

## 2022-09-23 MED ORDER — MELOXICAM 15 MG PO TABS
15.0000 mg | ORAL_TABLET | Freq: Every day | ORAL | 0 refills | Status: AC
  Filled 2022-09-23: qty 10, 10d supply, fill #0

## 2022-09-23 NOTE — Patient Instructions (Addendum)
It was great to see you today! Thank you for choosing [facility] for your primary care. Claudia Washington was seen for physical.  Today we addressed: I have placed the referral to sports medicine and physical therapy, they will call you  Take Meloxicam x5 days daily and then as needed. Use heating pads  If pain continues, come back in 4 weeks for imaging    If you haven't already, sign up for My Chart to have easy access to your labs results, and communication with your primary care physician.  We are checking some labs today. If they are abnormal, I will call you. If they are normal, I will send you a MyChart message (if it is active) or a letter in the mail. If you do not hear about your labs in the next 2 weeks, please call the office. I recommend that you always bring your medications to each appointment as this makes it easy to ensure you are on the correct medications and helps [place] not miss refills when you need them. Call the clinic at [phone] if your symptoms worsen or you have any concerns.  You should return to our clinic Return in about 4 weeks (around 10/21/2022) for neck pain . Please arrive 15 minutes before your appointment to ensure smooth check in process.  We appreciate your efforts in making this happen.  Thank you for allowing me to participate in your care, Erskine Emery, MD 09/23/2022, 4:28 PM PGY-2, [place]

## 2022-09-23 NOTE — Assessment & Plan Note (Signed)
This is a chronic condition and has been ongoing for years.  Patient has good strength and range of motion which is reassuring.  Given the length of time this has been ongoing, will refer to sports medicine and physical therapy for conservative treatment measures.  She may benefit from a steroid injection.

## 2022-09-23 NOTE — Assessment & Plan Note (Signed)
Concern for cervical radiculopathy down left arm with no other red flag symptoms.  Good strength on physical exam.  Will continue with physical therapy for conservative treatment and meloxicam.  Additionally, instructed the patient to return in 4 weeks.  If symptoms persist, we will continue with cervical MRI.

## 2022-09-23 NOTE — Progress Notes (Signed)
SUBJECTIVE:   Chief compliant/HPI: annual examination  Claudia Washington is a 42 y.o. who presents today for an annual exam.   Left shoulder  Cervical pain: Pain goes into the shoulder blades. No fever or chills. Has tried massaging the shoulder. Pain radiates from both shoulders up into the neck, middle back. No trauma to the area. Feels some numbness in the left arm. No weakness in the hands, good grip. No bowel and bladder incontinence   Right Shoulder Pain:  Right shoulder pain that has been present for years, last had MRI in 2018 that showed presence of tendinopathy.  She was referred to orthopedic surgery but never saw them.  She has trialed NSAIDs for pain relief.  Patient reports that she has popping of the right shoulder and she has difficulty reaching up to grab things.  No loss of strength.    Review of systems reviewed.  Updated history tabs and problem list.   Patient does not smoke, no alcohol use, walks daily, no concerns for STI, feels safe in her relationship. She has 5 children and works at [employer].   OBJECTIVE:   BP 104/60   Pulse 72   Ht '5\' 4"'$  (1.626 m)   Wt 126 lb 9.6 oz (57.4 kg)   LMP 09/20/2022   SpO2 98%   BMI 21.73 kg/m   General: Alert and oriented in no apparent distress Heart: Regular rate and rhythm with no murmurs appreciated Lungs: normal wob  Abdomen: no abdominal pain Skin: Warm and dry GU: Normal with scant blood in the vaginal vault with normal cervix and vulva, chaperoned by Marcelle Smiling  Shoulder Musculoskeletal Exam Inspection   Right     Ecchymosis: none     Peripheral edema: none   Left     Ecchymosis: none     Peripheral edema: none  Palpation   Right     Crepitus: no crepitus     Increased warmth: none     Tenderness: none   Left     Crepitus: no crepitus     Increased warmth: none     Tenderness: none  Range of Motion   Right     Active ROM: pain. Active ROM comment: Pain past 90 degrees.       Passive ROM: pain.    Left     Active ROM: normal.      Passive ROM: normal.   Strength   Right     External rotation: 5/5.      Internal rotation: 5/5.      Abduction: 5/5.      Biceps: 5/5.      Triceps: 5/5.    Left     External rotation: 5/5.      Internal rotation: 5/5.      Abduction: 5/5.      Biceps: 5/5.      Triceps: 5/5.   Neurovascular   Right     Right shoulder nerve sensation is normal.     Radial pulse: normal     Capillary refill: brisk   Left     Left shoulder nerve sensation is normal.     Radial pulse: normal     Capillary refill: brisk  Special Tests   Right   Rotator Cuff Signs     Supraspinatus: negative (pain with empty can)      Painful arc test: positive    Instability Signs     Joint laxity: negative  Cervical Spine Musculoskeletal Exam Gait   Gait is normal.  Inspection   Cervical spine inspection is normal.  Palpation   Tenderness: present     Paraspinous: right and left     Trapezius: right and left  Range of Motion   Cervical spine range of motion is normal.      Cervical flexion detail: pain.    Cervical extension: normal. Cervical extension detail: pain.    Right     Lateral rotation: normal. Lateral rotation detail: pain.    Left     Lateral rotation: normal. Lateral rotation detail: pain.    Strength   Right     Shoulder external rotation: 5/5.      Biceps: 5/5.      Triceps: 5/5.    Left     Shoulder external rotation: 5/5.      Biceps: 5/5.      Triceps: 5/5.   Neurovascular   Right     Radial pulse: normal   Left     Radial pulse: normal    ASSESSMENT/PLAN:   Screening for cervical cancer Pap smear today.   Rotator cuff disorder, right This is a chronic condition and has been ongoing for years.  Patient has good strength and range of motion which is reassuring.  Given the length of time this has been ongoing, will refer to sports medicine and physical therapy for conservative treatment measures.   She may benefit from a steroid injection.  Neck pain Concern for cervical radiculopathy down left arm with no other red flag symptoms.  Good strength on physical exam.  Will continue with physical therapy for conservative treatment and meloxicam.  Additionally, instructed the patient to return in 4 weeks.  If symptoms persist, we will continue with cervical MRI.    Annual Examination  See AVS for age appropriate recommendations.   PHQ score     09/23/2022    4:10 PM 09/07/2019    2:51 PM 03/22/2018   10:35 AM  PHQ9 SCORE ONLY  PHQ-9 Total Score 4 5 0   Reviewed and discussed. Blood pressure reviewed and at goal.    Considered the following items based upon USPSTF recommendations: HIV testing:  NR 8 yrs ago Syphilis if at high risk: NR 8 years ago  GC/CT negative 8 yrs ago  Lipid panel (nonfasting or fasting) discussed based upon AHA recommendations and not ordered.  Consider repeat every 4-6 years.  Reviewed risk factors for latent tuberculosis and not indicated  Cervical cancer screening:  ordered today Immunizations    Follow up in 4 weeks or sooner if indicated.    Erskine Emery, MD [place]

## 2022-09-23 NOTE — Assessment & Plan Note (Signed)
Pap smear today. 

## 2022-09-29 LAB — CYTOLOGY - PAP
Comment: NEGATIVE
Diagnosis: NEGATIVE
High risk HPV: NEGATIVE

## 2022-09-30 ENCOUNTER — Encounter: Payer: Self-pay | Admitting: Student

## 2022-10-07 ENCOUNTER — Ambulatory Visit: Payer: Commercial Managed Care - PPO | Admitting: Sports Medicine

## 2022-10-15 ENCOUNTER — Ambulatory Visit: Payer: Commercial Managed Care - PPO

## 2022-10-22 ENCOUNTER — Ambulatory Visit: Payer: Self-pay | Admitting: Student

## 2022-10-22 NOTE — Progress Notes (Deleted)
    SUBJECTIVE:   CHIEF COMPLAINT / HPI:   Shoulder pain   Neck Pain  PERTINENT  PMH / PSH: ***  OBJECTIVE:   There were no vitals taken for this visit.  ***  ASSESSMENT/PLAN:   No problem-specific Assessment & Plan notes found for this encounter.     Levin Erp, MD [facility]

## 2022-10-22 NOTE — Progress Notes (Deleted)
  SUBJECTIVE:   CHIEF COMPLAINT / HPI:   Neck Pain  Shoulder pain:  -p/f follow up  -previously instructed to continue with PT, ref to [facility] -Meloxicam given   PERTINENT  PMH / PSH:   GERD   Patient Care Team: Evelena Leyden, DO as PCP - General (Family Medicine) OBJECTIVE:  LMP 09/20/2022  Physical Exam  General: Alert and oriented in no apparent distress Heart: Regular rate and rhythm with no murmurs appreciated Lungs: CTA bilaterally, no wheezing Abdomen: Bowel sounds present, no abdominal pain Skin: Warm and dry Extremities: No lower extremity edema  ASSESSMENT/PLAN:  There are no diagnoses linked to this encounter. No follow-ups on file. Claudia Martinez, MD 10/22/2022, 8:13 AM PGY-2, [facility] {    This will disappear when note is signed, click to select method of visit    :1}

## 2022-10-25 ENCOUNTER — Ambulatory Visit: Payer: Commercial Managed Care - PPO | Admitting: Student

## 2022-11-04 ENCOUNTER — Other Ambulatory Visit: Payer: Self-pay

## 2022-11-04 ENCOUNTER — Ambulatory Visit: Payer: Commercial Managed Care - PPO | Attending: Family Medicine

## 2022-11-04 ENCOUNTER — Ambulatory Visit: Payer: Commercial Managed Care - PPO | Admitting: Student

## 2022-11-04 DIAGNOSIS — M6281 Muscle weakness (generalized): Secondary | ICD-10-CM

## 2022-11-04 DIAGNOSIS — G8929 Other chronic pain: Secondary | ICD-10-CM | POA: Diagnosis not present

## 2022-11-04 DIAGNOSIS — M25511 Pain in right shoulder: Secondary | ICD-10-CM | POA: Diagnosis not present

## 2022-11-04 DIAGNOSIS — M542 Cervicalgia: Secondary | ICD-10-CM | POA: Diagnosis not present

## 2022-11-04 NOTE — Therapy (Signed)
OUTPATIENT PHYSICAL THERAPY SHOULDER EVALUATION   Patient Name: Claudia Washington MRN: [id number] DOB:02/05/1981, 42 y.o., female Today's Date: 11/04/2022  END OF SESSION:  PT End of Session - 11/04/22 1611     Visit Number 1    Number of Visits 17    Date for PT Re-Evaluation 12/30/22    Authorization Type [facility] [employer]    PT Start Time 1540   arrived late   PT Stop Time 1610    PT Time Calculation (min) 30 min    Activity Tolerance Patient tolerated treatment well    Behavior During Therapy [facility] for tasks assessed/performed             Past Medical History:  Diagnosis Date   Anemia    Irregular menses    Superficial thrombophlebitis    Past Surgical History:  Procedure Laterality Date   GANGLION CYST EXCISION     TUBAL LIGATION  2008   Patient Active Problem List   Diagnosis Date Noted   Screening for cervical cancer 09/23/2022   Chronic left shoulder pain 09/23/2022   Neck pain 09/23/2022   Rotator cuff disorder, right 03/22/2018   Excessive gas 03/22/2018   Constipation 09/19/2017   Gastroesophageal reflux disease without esophagitis 09/19/2017    PCP: Evelena Leyden, DO  REFERRING PROVIDER: Doreene Eland, MD   REFERRING DIAG:  [id number] (ICD-10-CM) - Chronic left shoulder pain M54.2 (ICD-10-CM) - Neck pain  THERAPY DIAG:  Chronic right shoulder pain - Plan: PT plan of care cert/re-cert  Cervicalgia - Plan: PT plan of care cert/re-cert  Muscle weakness (generalized) - Plan: PT plan of care cert/re-cert  Rationale for Evaluation and Treatment: Rehabilitation  ONSET DATE: Chronic  SUBJECTIVE:                                                                                                                                                                                      SUBJECTIVE STATEMENT: Pt presents to PT with reports of neck and bilateral shoulder pain. Notes pain R>L in shoulder, denies N/T down UE. Denies trauma or MOI, R shoulder  pain has been gradually getting worse and goes into neck. She has difficulty with overhead reaching and work related to housekeeping duties at hospital.   Hand dominance: Right  PERTINENT HISTORY: None  PAIN:  Are you having pain?  Yes: NPRS scale: 0/10 Worst: 10/10 Pain location: R anterior shoulder, upper traps, neck Pain description: sharp, achy Aggravating factors: overhead reaching, lifting, pressure through arm Relieving factors: medication, heat  PRECAUTIONS: None  WEIGHT BEARING RESTRICTIONS: No  FALLS:  Has patient fallen in last 6 months? No  LIVING ENVIRONMENT: Lives  with: lives with their family Lives in: House/apartment  OCCUPATION: [employer]  PLOF: Independent  PATIENT GOALS:decrease pain with shoulder and neck movements, especially with overhead  OBJECTIVE:   DIAGNOSTIC FINDINGS:  See imaging  PATIENT SURVEYS:  FOTO: 76% function; 79% predicted  COGNITION: Overall cognitive status: Within functional limits for tasks assessed     SENSATION: WFL  POSTURE: Rounded shoulders, fwd head  UPPER EXTREMITY ROM:   Active ROM Right eval Left eval  Shoulder flexion 105 WNL  Shoulder extension    Shoulder abduction 125 WFL  Shoulder adduction    Shoulder internal rotation L2 T8  Shoulder external rotation    Elbow flexion    Elbow extension    Wrist flexion    Wrist extension    Wrist ulnar deviation    Wrist radial deviation    Wrist pronation    Wrist supination    (Blank rows = not tested)  UPPER EXTREMITY MMT:  MMT Right eval Left eval  Shoulder flexion    Shoulder extension    Shoulder abduction    Shoulder adduction    Shoulder internal rotation 3/5 5/5  Shoulder external rotation 3/5 5/5  Middle trapezius    Lower trapezius    Elbow flexion    Elbow extension    Wrist flexion    Wrist extension    Wrist ulnar deviation    Wrist radial deviation    Wrist pronation    Wrist supination    Grip strength  (lbs)    (Blank rows = not tested)  SHOULDER SPECIAL TESTS: Impingement tests: Neer impingement test: positive  and Hawkins/Kennedy impingement test: positive  SLAP lesions: DNT Instability tests: DNT Rotator cuff assessment: Internal rotation lag sign: negative Biceps assessment: DNT  PALPATION:  TTP to R upper trap, R supraspinatus    TREATMENT: [facility] Adult PT Treatment:                                                DATE: 11/04/2022 Therapeutic Exercise: Upper trap stretch x 30" R shoulder IR/ER isometric x 5 - 5" hold Row x 5 blue band  PATIENT EDUCATION: Education details: eval findings, FOTO, HEP, POC Person educated: Patient Education method: Explanation, Demonstration, and Handouts Education comprehension: verbalized understanding and returned demonstration  HOME EXERCISE PROGRAM: Access Code: [email or web address] URL: [email or web address] Date: 11/04/2022 Prepared by: Edwinna Areola  Exercises - Seated Upper Trapezius Stretch  - 1 x daily - 7 x weekly - 2-3 reps - 30 sec hold - Standing Isometric Shoulder Internal Rotation with Towel Roll at Doorway  - 1 x daily - 7 x weekly - 2-3 sets - 10 reps - 3 sec hold - Standing Isometric Shoulder External Rotation with Doorway and Towel Roll  - 1 x daily - 7 x weekly - 2-3 sets - 10 reps - 3 sec hold - Standing Shoulder Row with Anchored Resistance  - 1 x daily - 7 x weekly - 3 sets - 10 reps - blue band hold  ASSESSMENT:  CLINICAL IMPRESSION: Patient is a 42 y.o. F who was seen today for physical therapy evaluation and treatment for chronic neck and shoulder pain. Physical findings are consistent with MD impression as pt demonstrates decrease in R shoulder ROM as well as shoulder strength. Her neck ROM is WFL with pain most likely secondary to significant tone  and trigger points in R upper trap. Her FOTO score demonstrates decrease in subjective functional ability below PLOF. Pt would benefit from skilled PT  services working  on improving RTC and periscapular strength for improved dynamic stabilization and manual for decreasing R upper trap tone to decrease pain.  OBJECTIVE IMPAIRMENTS: decreased activity tolerance, difficulty walking, decreased ROM, decreased strength, increased muscle spasms, impaired flexibility, and pain.   ACTIVITY LIMITATIONS: carrying, lifting, and reach over head  PARTICIPATION LIMITATIONS: driving, shopping, community activity, occupation, and yard work  PERSONAL FACTORS: Time since onset of injury/illness/exacerbation are also affecting patient's functional outcome.   REHAB POTENTIAL: Excellent  CLINICAL DECISION MAKING: Stable/uncomplicated  EVALUATION COMPLEXITY: Low   GOALS: Goals reviewed with patient? No  SHORT TERM GOALS: Target date: 11/25/2022   Pt will be compliant and knowledgeable with initial HEP for improved comfort and carryover Baseline: initial HEP given  Goal status: INITIAL  2.  Pt will self report shoulder and neck pain no greater than 6/10 for improved comfort and functional ability Baseline: 10/10 at worst Goal status: INITIAL   LONG TERM GOALS: Target date: 12/30/2022   Pt will improve FOTO function score to no less than 79% as proxy for functional improvement Baseline: 76% function Goal status: INITIAL   2.  Pt will self report shoulder and neck pain no greater than 3/10 for improved comfort and functional ability Baseline: 10/10 at worst Goal status: INITIAL   3.  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  Pt will increase R shoulder IR/ER MMT to no less than 4/5 for improved dynamic stabilization and decreased pain with overhead motions Baseline: see MMT chart Goal status: INITIAL  PLAN:  PT FREQUENCY: 1-2x/week  PT DURATION: 8 weeks  PLANNED INTERVENTIONS: Therapeutic exercises, Therapeutic  activity, Neuromuscular re-education, Balance training, Gait training, Patient/Family education, Self Care, Joint mobilization, Dry Needling, Electrical stimulation, Cryotherapy, Moist heat, Vasopneumatic device, Manual therapy, and Re-evaluation  PLAN FOR NEXT SESSION: assess HEP response, progress shoulder stabilizer strength, TPDN and manual   Eloy End, PT 11/04/2022, 4:13 PM

## 2022-11-09 ENCOUNTER — Telehealth: Payer: Self-pay

## 2022-11-09 ENCOUNTER — Ambulatory Visit: Payer: Commercial Managed Care - PPO

## 2022-11-09 NOTE — Telephone Encounter (Signed)
PT called and left voicemail regarding missed visit on 11/09/2022. Also left a reminder of next session.  Eloy End, PT 11/09/22 4:29 PM

## 2022-11-16 NOTE — Therapy (Deleted)
OUTPATIENT PHYSICAL THERAPY TREATMENT NOTE   Patient Name: Claudia Washington MRN: [id number] DOB:Sep 07, 1980, 42 y.o., female Today's Date: 11/16/2022  PCP: Evelena Leyden, DO  REFERRING PROVIDER: Doreene Eland, MD   END OF SESSION:    Past Medical History:  Diagnosis Date   Anemia    Irregular menses    Superficial thrombophlebitis    Past Surgical History:  Procedure Laterality Date   GANGLION CYST EXCISION     TUBAL LIGATION  2008   Patient Active Problem List   Diagnosis Date Noted   Screening for cervical cancer 09/23/2022   Chronic left shoulder pain 09/23/2022   Neck pain 09/23/2022   Rotator cuff disorder, right 03/22/2018   Excessive gas 03/22/2018   Constipation 09/19/2017   Gastroesophageal reflux disease without esophagitis 09/19/2017    REFERRING DIAG: M25.512,G89.29 (ICD-10-CM) - Chronic left shoulder pain M54.2 (ICD-10-CM) - Neck pain  THERAPY DIAG:  No diagnosis found.  Rationale for Evaluation and Treatment Rehabilitation  PERTINENT HISTORY: None   PRECAUTIONS: None   SUBJECTIVE:                                                                                                                                                                                      SUBJECTIVE STATEMENT:  ***   PAIN:  Are you having pain? {[id number]}   OBJECTIVE: (objective measures completed at initial evaluation unless otherwise dated)   DIAGNOSTIC FINDINGS:  See imaging   PATIENT SURVEYS:  FOTO: 76% function; 79% predicted   COGNITION: Overall cognitive status: Within functional limits for tasks assessed                                  SENSATION: WFL   POSTURE: Rounded shoulders, fwd head   UPPER EXTREMITY ROM:    Active ROM Right eval Left eval  Shoulder flexion 105 WNL  Shoulder extension      Shoulder abduction 125 WFL  Shoulder adduction      Shoulder internal rotation L2 T8  Shoulder external rotation      Elbow flexion       Elbow extension      Wrist flexion      Wrist extension      Wrist ulnar deviation      Wrist radial deviation      Wrist pronation      Wrist supination      (Blank rows = not tested)   UPPER EXTREMITY MMT:   MMT Right eval Left eval  Shoulder flexion      Shoulder extension  Shoulder abduction      Shoulder adduction      Shoulder internal rotation 3/5 5/5  Shoulder external rotation 3/5 5/5  Middle trapezius      Lower trapezius      Elbow flexion      Elbow extension      Wrist flexion      Wrist extension      Wrist ulnar deviation      Wrist radial deviation      Wrist pronation      Wrist supination      Grip strength (lbs)      (Blank rows = not tested)   SHOULDER SPECIAL TESTS: Impingement tests: Neer impingement test: positive  and Hawkins/Kennedy impingement test: positive  SLAP lesions: DNT Instability tests: DNT Rotator cuff assessment: Internal rotation lag sign: negative Biceps assessment: DNT   PALPATION:  TTP to R upper trap, R supraspinatus              TREATMENT: [facility] Adult PT Treatment:                                                DATE: 11/04/2022 Therapeutic Exercise: Upper trap stretch x 30" R shoulder IR/ER isometric x 5 - 5" hold Row x 5 blue band   PATIENT EDUCATION: Education details: eval findings, FOTO, HEP, POC Person educated: Patient Education method: Explanation, Demonstration, and Handouts Education comprehension: verbalized understanding and returned demonstration   HOME EXERCISE PROGRAM: Access Code: [email or web address] URL: [email or web address] Date: 11/04/2022 Prepared by: Edwinna Areola   Exercises - Seated Upper Trapezius Stretch  - 1 x daily - 7 x weekly - 2-3 reps - 30 sec hold - Standing Isometric Shoulder Internal Rotation with Towel Roll at Doorway  - 1 x daily - 7 x weekly - 2-3 sets - 10 reps - 3 sec hold - Standing Isometric Shoulder External Rotation with Doorway and Towel Roll  - 1 x daily - 7 x  weekly - 2-3 sets - 10 reps - 3 sec hold - Standing Shoulder Row with Anchored Resistance  - 1 x daily - 7 x weekly - 3 sets - 10 reps - blue band hold   ASSESSMENT:   CLINICAL IMPRESSION: Patient is a 42 y.o. F who was seen today for physical therapy evaluation and treatment for chronic neck and shoulder pain. Physical findings are consistent with MD impression as pt demonstrates decrease in R shoulder ROM as well as shoulder strength. Her neck ROM is WFL with pain most likely secondary to significant tone and trigger points in R upper trap. Her FOTO score demonstrates decrease in subjective functional ability below PLOF. Pt would benefit from skilled PT  services working on improving RTC and periscapular strength for improved dynamic stabilization and manual for decreasing R upper trap tone to decrease pain.   OBJECTIVE IMPAIRMENTS: decreased activity tolerance, difficulty walking, decreased ROM, decreased strength, increased muscle spasms, impaired flexibility, and pain.    ACTIVITY LIMITATIONS: carrying, lifting, and reach over head   PARTICIPATION LIMITATIONS: driving, shopping, community activity, occupation, and yard work   PERSONAL FACTORS: Time since onset of injury/illness/exacerbation are also affecting patient's functional outcome.    REHAB POTENTIAL: Excellent   CLINICAL DECISION MAKING: Stable/uncomplicated   EVALUATION COMPLEXITY: Low     GOALS: Goals reviewed with patient? No   SHORT TERM GOALS: Target  date: 11/25/2022   Pt will be compliant and knowledgeable with initial HEP for improved comfort and carryover Baseline: initial HEP given  Goal status: INITIAL   2.  Pt will self report shoulder and neck pain no greater than 6/10 for improved comfort and functional ability Baseline: 10/10 at worst Goal status: INITIAL    LONG TERM GOALS: Target date: 12/30/2022   Pt will improve FOTO function score to no less than 79% as proxy for functional improvement Baseline:  76% function Goal status: INITIAL    2.  Pt will self report shoulder and neck pain no greater than 3/10 for improved comfort and functional ability Baseline: 10/10 at worst Goal status: INITIAL    3.  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  Pt will increase R shoulder IR/ER MMT to no less than 4/5 for improved dynamic stabilization and decreased pain with overhead motions Baseline: see MMT chart Goal status: INITIAL   PLAN:   PT FREQUENCY: 1-2x/week   PT DURATION: 8 weeks   PLANNED INTERVENTIONS: Therapeutic exercises, Therapeutic activity, Neuromuscular re-education, Balance training, Gait training, Patient/Family education, Self Care, Joint mobilization, Dry Needling, Electrical stimulation, Cryotherapy, Moist heat, Vasopneumatic device, Manual therapy, and Re-evaluation   PLAN FOR NEXT SESSION: assess HEP response, progress shoulder stabilizer strength, TPDN and manual   Hildred Laser, PT 11/16/2022, 10:15 AM

## 2022-11-19 ENCOUNTER — Ambulatory Visit: Payer: Commercial Managed Care - PPO

## 2022-11-23 ENCOUNTER — Ambulatory Visit: Payer: Commercial Managed Care - PPO | Attending: Family Medicine

## 2022-11-23 DIAGNOSIS — G8929 Other chronic pain: Secondary | ICD-10-CM | POA: Insufficient documentation

## 2022-11-23 DIAGNOSIS — M25511 Pain in right shoulder: Secondary | ICD-10-CM | POA: Insufficient documentation

## 2022-11-23 DIAGNOSIS — M542 Cervicalgia: Secondary | ICD-10-CM | POA: Insufficient documentation

## 2022-11-25 ENCOUNTER — Ambulatory Visit: Payer: Commercial Managed Care - PPO

## 2022-11-25 DIAGNOSIS — M542 Cervicalgia: Secondary | ICD-10-CM | POA: Diagnosis not present

## 2022-11-25 DIAGNOSIS — M25511 Pain in right shoulder: Secondary | ICD-10-CM | POA: Diagnosis not present

## 2022-11-25 DIAGNOSIS — G8929 Other chronic pain: Secondary | ICD-10-CM | POA: Diagnosis not present

## 2022-11-25 NOTE — Therapy (Addendum)
OUTPATIENT PHYSICAL THERAPY TREATMENT NOTE/DISCHARGE  PHYSICAL THERAPY DISCHARGE SUMMARY  Visits from Start of Care: 2  Current functional level related to goals / functional outcomes: See goals/objective   Remaining deficits: Unable to assess   Education / Equipment: HEP   Patient agrees to discharge. Patient goals were unable to assess. Patient is being discharged due to not returning since the last visit.     Patient Name: Claudia Washington MRN: [id number] DOB:[birth year], 42 y.o., female Today's Date: 11/25/2022  PCP: Evelena Leyden, DO  REFERRING PROVIDER: Doreene Eland, MD   END OF SESSION:   PT End of Session - 11/25/22 0949     Visit Number 2    Number of Visits 17    Date for PT Re-Evaluation 12/30/22    Authorization Type [place] [employer]    PT Start Time 1000    PT Stop Time 1038    PT Time Calculation (min) 38 min    Activity Tolerance Patient tolerated treatment well    Behavior During Therapy [facility] for tasks assessed/performed             Past Medical History:  Diagnosis Date   Anemia    Irregular menses    Superficial thrombophlebitis    Past Surgical History:  Procedure Laterality Date   GANGLION CYST EXCISION     TUBAL LIGATION  2008   Patient Active Problem List   Diagnosis Date Noted   Screening for cervical cancer 09/23/2022   Chronic left shoulder pain 09/23/2022   Neck pain 09/23/2022   Rotator cuff disorder, right 03/22/2018   Excessive gas 03/22/2018   Constipation 09/19/2017   Gastroesophageal reflux disease without esophagitis 09/19/2017    REFERRING DIAG:  M25.512,G89.29 (ICD-10-CM) - Chronic left shoulder pain M54.2 (ICD-10-CM) - Neck pain  THERAPY DIAG:  Chronic right shoulder pain  Cervicalgia  Rationale for Evaluation and Treatment Rehabilitation  PERTINENT HISTORY: None   PRECAUTIONS: None   SUBJECTIVE:                                                                                                                                                                                       SUBJECTIVE STATEMENT:  Pt presents to PT with reports of pain in shoulder, upper trap, and scapular musculature. Has been compliant with HEP.    PAIN:  Are you having pain?  Yes: NPRS scale: 0/10 Worst: 10/10 Pain location: R anterior shoulder, upper traps, neck Pain description: sharp, achy Aggravating factors: overhead reaching, lifting, pressure through arm Relieving factors: medication, heat   OBJECTIVE: (objective measures completed at initial evaluation unless otherwise dated)  DIAGNOSTIC FINDINGS:  See imaging  PATIENT SURVEYS:  FOTO: 76% function; 79% predicted   COGNITION: Overall cognitive status: Within functional limits for tasks assessed                                  SENSATION: WFL   POSTURE: Rounded shoulders, fwd head   UPPER EXTREMITY ROM:    Active ROM Right eval Left eval  Shoulder flexion 105 WNL  Shoulder extension      Shoulder abduction 125 WFL  Shoulder adduction      Shoulder internal rotation L2 T8  Shoulder external rotation      Elbow flexion      Elbow extension      Wrist flexion      Wrist extension      Wrist ulnar deviation      Wrist radial deviation      Wrist pronation      Wrist supination      (Blank rows = not tested)   UPPER EXTREMITY MMT:   MMT Right eval Left eval  Shoulder flexion      Shoulder extension      Shoulder abduction      Shoulder adduction      Shoulder internal rotation 3/5 5/5  Shoulder external rotation 3/5 5/5  Middle trapezius      Lower trapezius      Elbow flexion      Elbow extension      Wrist flexion      Wrist extension      Wrist ulnar deviation      Wrist radial deviation      Wrist pronation      Wrist supination      Grip strength (lbs)      (Blank rows = not tested)   SHOULDER SPECIAL TESTS: Impingement tests: Neer impingement test: positive  and Hawkins/Kennedy impingement test: positive   SLAP lesions: DNT Instability tests: DNT Rotator cuff assessment: Internal rotation lag sign: negative Biceps assessment: DNT   PALPATION:  TTP to R upper trap, R supraspinatus              TREATMENT: [facility] Adult PT Treatment:                                                DATE: 11/25/2022 Therapeutic Exercise: UBE lvl 1.0 x 3 min while taking subjective Seated bilateral ER 2x10 RTB Seated levator scap stretch 2x30" each Upper trap stretch x 30" each Manual Therapy: Skilled palpation of trigger points for TPDN Positional release bilateral upper traps Suboccipital release Trigger point release to bilateral levator scap Grade V thoracic thrust manipulation with cavitation   Trigger Point Dry Needling Treatment: Pre-treatment instruction: Patient instructed on dry needling rationale, procedures, and possible side effects including pain during treatment (achy,cramping feeling), bruising, drop of blood, lightheadedness, nausea, sweating. Patient Consent Given: Yes Education handout provided: No Muscles treated: bilateral upper trap  Needle size and number: .30x90mm x 2 Electrical stimulation performed: No Parameters: N/A Treatment response/outcome: Twitch response elicited and Palpable decrease in muscle tension Post-treatment instructions: Patient instructed to expect possible mild to moderate muscle soreness later today and/or tomorrow. Patient instructed in methods to reduce muscle soreness and to continue prescribed HEP. If patient was dry needled over the lung field, patient was instructed  on signs and symptoms of pneumothorax and, however unlikely, to see immediate medical attention should they occur. Patient was also educated on signs and symptoms of infection and to seek medical attention should they occur. Patient verbalized understanding of these instructions and education.  [facility] Adult PT Treatment:                                                DATE: 11/04/2022 Therapeutic  Exercise: Upper trap stretch x 30" R shoulder IR/ER isometric x 5 - 5" hold Row x 5 blue band   PATIENT EDUCATION: Education details: continue HEP Person educated: Patient Education method: Explanation, Demonstration, and Handouts Education comprehension: verbalized understanding and returned demonstration   HOME EXERCISE PROGRAM: Access Code: [email or web address] URL: [email or web address] Date: 11/25/2022 Prepared by: Edwinna Areola  Exercises - Seated Upper Trapezius Stretch  - 1 x daily - 7 x weekly - 2-3 reps - 30 sec hold - Standing Isometric Shoulder Internal Rotation with Towel Roll at Doorway  - 1 x daily - 7 x weekly - 2-3 sets - 10 reps - 3 sec hold - Standing Isometric Shoulder External Rotation with Doorway and Towel Roll  - 1 x daily - 7 x weekly - 2-3 sets - 10 reps - 3 sec hold - Standing Shoulder Row with Anchored Resistance  - 1 x daily - 7 x weekly - 3 sets - 10 reps - blue band hold - Shoulder External Rotation and Scapular Retraction with Resistance  - 1 x daily - 7 x weekly - 2 sets - 10 reps - red band hold - Seated Levator Scapulae Stretch  - 1 x daily - 7 x weekly - 2 reps - 30 sec hold   ASSESSMENT:   CLINICAL IMPRESSION: Pt was able to complete all prescribed exercises with on adverse effect. Therapy focused on improving periscapular strength and decreasing soft tissue pain. Responded well to TPDN and manual, notiing 0/10 pain post session. Pt progressing as expected, will continue per POC.    OBJECTIVE IMPAIRMENTS: decreased activity tolerance, difficulty walking, decreased ROM, decreased strength, increased muscle spasms, impaired flexibility, and pain.    ACTIVITY LIMITATIONS: carrying, lifting, and reach over head   PARTICIPATION LIMITATIONS: driving, shopping, community activity, occupation, and yard work   PERSONAL FACTORS: Time since onset of injury/illness/exacerbation are also affecting patient's functional outcome.      GOALS: Goals  reviewed with patient? No   SHORT TERM GOALS: Target date: 11/25/2022   Pt will be compliant and knowledgeable with initial HEP for improved comfort and carryover Baseline: initial HEP given  Goal status: INITIAL   2.  Pt will self report shoulder and neck pain no greater than 6/10 for improved comfort and functional ability Baseline: 10/10 at worst Goal status: INITIAL    LONG TERM GOALS: Target date: 12/30/2022   Pt will improve FOTO function score to no less than 79% as proxy for functional improvement Baseline: 76% function Goal status: INITIAL    2.  Pt will self report shoulder and neck pain no greater than 3/10 for improved comfort and functional ability Baseline: 10/10 at worst Goal status: INITIAL    3.  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  Pt will increase R shoulder IR/ER MMT to no less than 4/5 for improved dynamic stabilization and decreased pain with overhead motions Baseline: see MMT chart Goal status: INITIAL   PLAN:   PT FREQUENCY: 1-2x/week   PT DURATION: 8 weeks   PLANNED INTERVENTIONS: Therapeutic exercises, Therapeutic activity, Neuromuscular re-education, Balance training, Gait training, Patient/Family education, Self Care, Joint mobilization, Dry Needling, Electrical stimulation, Cryotherapy, Moist heat, Vasopneumatic device, Manual therapy, and Re-evaluation   PLAN FOR NEXT SESSION: assess HEP response, progress shoulder stabilizer strength, TPDN and manual   Eloy End, PT 11/25/2022, 10:41 AM

## 2022-11-30 ENCOUNTER — Ambulatory Visit: Payer: Commercial Managed Care - PPO

## 2022-12-02 ENCOUNTER — Ambulatory Visit: Payer: Commercial Managed Care - PPO

## 2022-12-07 ENCOUNTER — Ambulatory Visit: Payer: Commercial Managed Care - PPO

## 2022-12-10 NOTE — Therapy (Deleted)
OUTPATIENT PHYSICAL THERAPY TREATMENT NOTE   Patient Name: Claudia Washington MRN: [id number] DOB:[birth year], 42 y.o., female Today's Date: 12/10/2022  PCP: Evelena Leyden, DO  REFERRING PROVIDER: Doreene Eland, MD   END OF SESSION:     Past Medical History:  Diagnosis Date   Anemia    Irregular menses    Superficial thrombophlebitis    Past Surgical History:  Procedure Laterality Date   GANGLION CYST EXCISION     TUBAL LIGATION  2008   Patient Active Problem List   Diagnosis Date Noted   Screening for cervical cancer 09/23/2022   Chronic left shoulder pain 09/23/2022   Neck pain 09/23/2022   Rotator cuff disorder, right 03/22/2018   Excessive gas 03/22/2018   Constipation 09/19/2017   Gastroesophageal reflux disease without esophagitis 09/19/2017    REFERRING DIAG:  M25.512,G89.29 (ICD-10-CM) - Chronic left shoulder pain M54.2 (ICD-10-CM) - Neck pain  THERAPY DIAG:  No diagnosis found.  Rationale for Evaluation and Treatment Rehabilitation  PERTINENT HISTORY: None   PRECAUTIONS: None   SUBJECTIVE:                                                                                                                                                                                      SUBJECTIVE STATEMENT:  Pt presents to PT with reports of pain in shoulder, upper trap, and scapular musculature. Has been compliant with HEP.    PAIN:  Are you having pain?  Yes: NPRS scale: 0/10 Worst: 10/10 Pain location: R anterior shoulder, upper traps, neck Pain description: sharp, achy Aggravating factors: overhead reaching, lifting, pressure through arm Relieving factors: medication, heat   OBJECTIVE: (objective measures completed at initial evaluation unless otherwise dated)  DIAGNOSTIC FINDINGS:  See imaging   PATIENT SURVEYS:  FOTO: 76% function; 79% predicted   COGNITION: Overall cognitive status: Within functional limits for tasks assessed                                   SENSATION: WFL   POSTURE: Rounded shoulders, fwd head   UPPER EXTREMITY ROM:    Active ROM Right eval Left eval  Shoulder flexion 105 WNL  Shoulder extension      Shoulder abduction 125 WFL  Shoulder adduction      Shoulder internal rotation L2 T8  Shoulder external rotation      Elbow flexion      Elbow extension      Wrist flexion      Wrist extension      Wrist ulnar deviation      Wrist radial deviation  Wrist pronation      Wrist supination      (Blank rows = not tested)   UPPER EXTREMITY MMT:   MMT Right eval Left eval  Shoulder flexion      Shoulder extension      Shoulder abduction      Shoulder adduction      Shoulder internal rotation 3/5 5/5  Shoulder external rotation 3/5 5/5  Middle trapezius      Lower trapezius      Elbow flexion      Elbow extension      Wrist flexion      Wrist extension      Wrist ulnar deviation      Wrist radial deviation      Wrist pronation      Wrist supination      Grip strength (lbs)      (Blank rows = not tested)   SHOULDER SPECIAL TESTS: Impingement tests: Neer impingement test: positive  and Hawkins/Kennedy impingement test: positive  SLAP lesions: DNT Instability tests: DNT Rotator cuff assessment: Internal rotation lag sign: negative Biceps assessment: DNT   PALPATION:  TTP to R upper trap, R supraspinatus              TREATMENT: [facility] Adult PT Treatment:                                                DATE: 11/25/2022 Therapeutic Exercise: UBE lvl 1.0 x 3 min while taking subjective Seated bilateral ER 2x10 RTB Seated levator scap stretch 2x30" each Upper trap stretch x 30" each Manual Therapy: Skilled palpation of trigger points for TPDN Positional release bilateral upper traps Suboccipital release Trigger point release to bilateral levator scap Grade V thoracic thrust manipulation with cavitation   Trigger Point Dry Needling Treatment: Pre-treatment instruction: Patient instructed  on dry needling rationale, procedures, and possible side effects including pain during treatment (achy,cramping feeling), bruising, drop of blood, lightheadedness, nausea, sweating. Patient Consent Given: Yes Education handout provided: No Muscles treated: bilateral upper trap  Needle size and number: .30x60mm x 2 Electrical stimulation performed: No Parameters: N/A Treatment response/outcome: Twitch response elicited and Palpable decrease in muscle tension Post-treatment instructions: Patient instructed to expect possible mild to moderate muscle soreness later today and/or tomorrow. Patient instructed in methods to reduce muscle soreness and to continue prescribed HEP. If patient was dry needled over the lung field, patient was instructed on signs and symptoms of pneumothorax and, however unlikely, to see immediate medical attention should they occur. Patient was also educated on signs and symptoms of infection and to seek medical attention should they occur. Patient verbalized understanding of these instructions and education.  [facility] Adult PT Treatment:                                                DATE: 11/04/2022 Therapeutic Exercise: Upper trap stretch x 30" R shoulder IR/ER isometric x 5 - 5" hold Row x 5 blue band   PATIENT EDUCATION: Education details: continue HEP Person educated: Patient Education method: Explanation, Demonstration, and Handouts Education comprehension: verbalized understanding and returned demonstration   HOME EXERCISE PROGRAM: Access Code: [email or web address] URL: [email or web address] Date: 11/25/2022 Prepared by: Edwinna Areola  Exercises - Seated Upper Trapezius Stretch  - 1 x daily - 7 x weekly - 2-3 reps - 30 sec hold - Standing Isometric Shoulder Internal Rotation with Towel Roll at Doorway  - 1 x daily - 7 x weekly - 2-3 sets - 10 reps - 3 sec hold - Standing Isometric Shoulder External Rotation with Doorway and Towel Roll  - 1 x daily - 7 x weekly -  2-3 sets - 10 reps - 3 sec hold - Standing Shoulder Row with Anchored Resistance  - 1 x daily - 7 x weekly - 3 sets - 10 reps - blue band hold - Shoulder External Rotation and Scapular Retraction with Resistance  - 1 x daily - 7 x weekly - 2 sets - 10 reps - red band hold - Seated Levator Scapulae Stretch  - 1 x daily - 7 x weekly - 2 reps - 30 sec hold   ASSESSMENT:   CLINICAL IMPRESSION: Pt was able to complete all prescribed exercises with on adverse effect. Therapy focused on improving periscapular strength and decreasing soft tissue pain. Responded well to TPDN and manual, notiing 0/10 pain post session. Pt progressing as expected, will continue per POC.    OBJECTIVE IMPAIRMENTS: decreased activity tolerance, difficulty walking, decreased ROM, decreased strength, increased muscle spasms, impaired flexibility, and pain.    ACTIVITY LIMITATIONS: carrying, lifting, and reach over head   PARTICIPATION LIMITATIONS: driving, shopping, community activity, occupation, and yard work   PERSONAL FACTORS: Time since onset of injury/illness/exacerbation are also affecting patient's functional outcome.      GOALS: Goals reviewed with patient? No   SHORT TERM GOALS: Target date: 11/25/2022   Pt will be compliant and knowledgeable with initial HEP for improved comfort and carryover Baseline: initial HEP given  Goal status: INITIAL   2.  Pt will self report shoulder and neck pain no greater than 6/10 for improved comfort and functional ability Baseline: 10/10 at worst Goal status: INITIAL    LONG TERM GOALS: Target date: 12/30/2022   Pt will improve FOTO function score to no less than 79% as proxy for functional improvement Baseline: 76% function Goal status: INITIAL    2.  Pt will self report shoulder and neck pain no greater than 3/10 for improved comfort and functional ability Baseline: 10/10 at worst Goal status: INITIAL    3.  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  Pt will increase R shoulder IR/ER MMT to no less than 4/5 for improved dynamic stabilization and decreased pain with overhead motions Baseline: see MMT chart Goal status: INITIAL   PLAN:   PT FREQUENCY: 1-2x/week   PT DURATION: 8 weeks   PLANNED INTERVENTIONS: Therapeutic exercises, Therapeutic activity, Neuromuscular re-education, Balance training, Gait training, Patient/Family education, Self Care, Joint mobilization, Dry Needling, Electrical stimulation, Cryotherapy, Moist heat, Vasopneumatic device, Manual therapy, and Re-evaluation   PLAN FOR NEXT SESSION: assess HEP response, progress shoulder stabilizer strength, TPDN and manual   Hildred Laser, PT 12/10/2022, 11:42 AM

## 2022-12-13 ENCOUNTER — Telehealth: Payer: Self-pay

## 2022-12-13 ENCOUNTER — Ambulatory Visit: Payer: Commercial Managed Care - PPO | Attending: Family Medicine

## 2022-12-13 NOTE — Telephone Encounter (Signed)
TC due to missed visit.  Spoke directly to patient, she had forgotten appointment.  Instructed to schedule one additional visit due to multiple missed and cancelled appointments.

## 2023-07-15 ENCOUNTER — Other Ambulatory Visit (HOSPITAL_COMMUNITY): Payer: Self-pay

## 2024-07-26 ENCOUNTER — Ambulatory Visit (INDEPENDENT_AMBULATORY_CARE_PROVIDER_SITE_OTHER): Payer: Self-pay | Admitting: Family Medicine

## 2024-07-26 ENCOUNTER — Encounter: Payer: Self-pay | Admitting: Family Medicine

## 2024-07-26 VITALS — BP 110/68 | HR 100 | Temp 97.8°F | Wt 152.1 lb

## 2024-07-26 DIAGNOSIS — N750 Cyst of Bartholin's gland: Secondary | ICD-10-CM

## 2024-07-26 NOTE — Patient Instructions (Addendum)
 It was great to see you today! Thank you for choosing [facility] for your primary care.  Today we addressed: 1. Bartholin gland cyst I have referred you to gynecology urgently, they will call you to set up an appointment. Please give us  a call if you don't hear back in the next 1 week.  Please use warm compresses and sitz baths to promote drainage of the cyst.  You should return to our clinic if having fevers or worsening pain.  Thank you for coming to see us  at [facility] and for the opportunity to care for you! Natalea Sutliff, MD 07/26/2024, 4:59 PM

## 2024-07-26 NOTE — Progress Notes (Signed)
" ° °  SUBJECTIVE:   CHIEF COMPLAINT / HPI:  Claudia Washington is a 44 y.o. female with a pertinent past medical history of GERD presenting to the clinic for painful labial mass.  Labial mass Patient reports that on Dec 27th, she noticed some vaginal irritation. She palpated the labia and felt a small nodule. She has had persistent irritation since which has progressively worsened. She feels pain and swelling of the right labia. She has difficulty walking normally due to the pain.  Denies dysuria, urinary urgency, and urinary frequency. No benefit with heating packs, ibuprofen . Just finished period, period is normally regular.  Sexually active with a single monogamous partner. Does shave with electric clippers, occasionally gets ingrown hairs. Reports that she is very sensitive/easily irritated in the vaginal area. Denies fevers, chills.   PERTINENT PMH / PSH: GERD  *Remainder reviewed in problem list.   OBJECTIVE:   BP 110/68   Pulse 100   Temp 97.8 F (36.6 C) (Oral)   Wt 152 lb 2 oz (69 kg)   LMP 07/21/2024   SpO2 100%   BMI 26.11 kg/m   Pelvic exam:  Vulva: ~4 cm right side Bartholin gland cyst, mildly erythematous and significantly tender to touch. Vagina: Normal appearing vagina with normal color, no lesions, with no discharge present. Cervix: Deferred. *Chaperone Nelson Pate, CMA present for pelvic exam.   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  Afebrile and no evidence of systemic infection. - Urgent ambulatory referral to gynecology, messaged referral coordinator - Recommended frequent sitz baths and warm compresses to promote drainage - Return if develop fevers or worsening pain  No follow-ups on file.  Claudia Stehle Toma, MD [facility] "

## 2024-08-11 ENCOUNTER — Ambulatory Visit (INDEPENDENT_AMBULATORY_CARE_PROVIDER_SITE_OTHER)

## 2024-08-11 ENCOUNTER — Ambulatory Visit (HOSPITAL_COMMUNITY)
Admission: EM | Admit: 2024-08-11 | Discharge: 2024-08-11 | Disposition: A | Discharge status: Home / Self Care | Attending: Student | Admitting: Student

## 2024-08-11 ENCOUNTER — Encounter (HOSPITAL_COMMUNITY): Payer: Self-pay

## 2024-08-11 DIAGNOSIS — M79644 Pain in right finger(s): Secondary | ICD-10-CM

## 2024-08-11 DIAGNOSIS — G8929 Other chronic pain: Secondary | ICD-10-CM

## 2024-08-11 NOTE — ED Triage Notes (Signed)
 Patient presents to the office for right thumb pain and swelling. Patient states she had old injuries to her thumb in 2016.
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
## 2024-08-21 ENCOUNTER — Encounter: Payer: Self-pay | Admitting: Obstetrics
